# Patient Record
Sex: Female | Born: 1950 | Race: White | Hispanic: No | Smoking: Former smoker
Health system: Southern US, Community
[De-identification: ages and names within clinical notes are randomized; demographics above are authoritative.]

## PROBLEM LIST (undated history)

## (undated) DIAGNOSIS — I38 Endocarditis, valve unspecified: Secondary | ICD-10-CM

## (undated) DIAGNOSIS — D649 Anemia, unspecified: Secondary | ICD-10-CM

## (undated) DIAGNOSIS — A499 Bacterial infection, unspecified: Secondary | ICD-10-CM

## (undated) DIAGNOSIS — T7840XA Allergy, unspecified, initial encounter: Secondary | ICD-10-CM

## (undated) DIAGNOSIS — N390 Urinary tract infection, site not specified: Secondary | ICD-10-CM

## (undated) DIAGNOSIS — I10 Essential (primary) hypertension: Secondary | ICD-10-CM

## (undated) DIAGNOSIS — K219 Gastro-esophageal reflux disease without esophagitis: Secondary | ICD-10-CM

## (undated) HISTORY — PX: CHOLECYSTECTOMY: SHX55

## (undated) HISTORY — DX: Urinary tract infection, site not specified: N39.0

## (undated) HISTORY — DX: Bacterial infection, unspecified: A49.9

## (undated) HISTORY — PX: UPPER GI ENDOSCOPY: SHX6162

## (undated) HISTORY — DX: Essential (primary) hypertension: I10

## (undated) HISTORY — DX: Allergy, unspecified, initial encounter: T78.40XA

## (undated) HISTORY — DX: Gastro-esophageal reflux disease without esophagitis: K21.9

## (undated) HISTORY — PX: COLONOSCOPY: SHX174

---

## 1998-03-22 ENCOUNTER — Ambulatory Visit (HOSPITAL_BASED_OUTPATIENT_CLINIC_OR_DEPARTMENT_OTHER): Admission: RE | Admit: 1998-03-22 | Discharge: 1998-03-22 | Payer: Self-pay | Admitting: Plastic Surgery

## 1999-03-13 ENCOUNTER — Encounter: Payer: Self-pay | Admitting: Gynecology

## 1999-03-13 ENCOUNTER — Encounter: Admission: RE | Admit: 1999-03-13 | Discharge: 1999-03-13 | Payer: Self-pay | Admitting: Gynecology

## 1999-12-28 ENCOUNTER — Other Ambulatory Visit: Admission: RE | Admit: 1999-12-28 | Discharge: 1999-12-28 | Payer: Self-pay | Admitting: Gynecology

## 2000-04-25 ENCOUNTER — Encounter: Payer: Self-pay | Admitting: Gynecology

## 2000-04-25 ENCOUNTER — Encounter: Admission: RE | Admit: 2000-04-25 | Discharge: 2000-04-25 | Payer: Self-pay | Admitting: Gynecology

## 2000-12-30 ENCOUNTER — Other Ambulatory Visit: Admission: RE | Admit: 2000-12-30 | Discharge: 2000-12-30 | Payer: Self-pay | Admitting: Gynecology

## 2001-05-01 ENCOUNTER — Encounter: Payer: Self-pay | Admitting: Gynecology

## 2001-05-01 ENCOUNTER — Encounter: Admission: RE | Admit: 2001-05-01 | Discharge: 2001-05-01 | Payer: Self-pay | Admitting: Gynecology

## 2002-01-28 ENCOUNTER — Other Ambulatory Visit: Admission: RE | Admit: 2002-01-28 | Discharge: 2002-01-28 | Payer: Self-pay | Admitting: Gynecology

## 2002-05-05 ENCOUNTER — Encounter: Payer: Self-pay | Admitting: Gynecology

## 2002-05-05 ENCOUNTER — Encounter: Admission: RE | Admit: 2002-05-05 | Discharge: 2002-05-05 | Payer: Self-pay | Admitting: Gynecology

## 2003-02-02 ENCOUNTER — Other Ambulatory Visit: Admission: RE | Admit: 2003-02-02 | Discharge: 2003-02-02 | Payer: Self-pay | Admitting: Gynecology

## 2003-05-13 ENCOUNTER — Encounter: Admission: RE | Admit: 2003-05-13 | Discharge: 2003-05-13 | Payer: Self-pay | Admitting: Gynecology

## 2004-02-15 ENCOUNTER — Other Ambulatory Visit: Admission: RE | Admit: 2004-02-15 | Discharge: 2004-02-15 | Payer: Self-pay | Admitting: Gynecology

## 2004-06-16 ENCOUNTER — Encounter: Admission: RE | Admit: 2004-06-16 | Discharge: 2004-06-16 | Payer: Self-pay | Admitting: Gynecology

## 2005-03-13 ENCOUNTER — Other Ambulatory Visit: Admission: RE | Admit: 2005-03-13 | Discharge: 2005-03-13 | Payer: Self-pay | Admitting: Gynecology

## 2005-07-11 ENCOUNTER — Encounter: Admission: RE | Admit: 2005-07-11 | Discharge: 2005-07-11 | Payer: Self-pay | Admitting: Gynecology

## 2006-03-22 ENCOUNTER — Other Ambulatory Visit: Admission: RE | Admit: 2006-03-22 | Discharge: 2006-03-22 | Payer: Self-pay | Admitting: Gynecology

## 2006-07-24 ENCOUNTER — Encounter: Admission: RE | Admit: 2006-07-24 | Discharge: 2006-07-24 | Payer: Self-pay | Admitting: Gynecology

## 2006-10-22 ENCOUNTER — Ambulatory Visit: Payer: Self-pay | Admitting: Internal Medicine

## 2006-11-05 ENCOUNTER — Ambulatory Visit: Payer: Self-pay | Admitting: Internal Medicine

## 2006-11-05 ENCOUNTER — Encounter: Payer: Self-pay | Admitting: Internal Medicine

## 2007-08-06 ENCOUNTER — Encounter: Admission: RE | Admit: 2007-08-06 | Discharge: 2007-08-06 | Payer: Self-pay | Admitting: Gynecology

## 2008-08-16 ENCOUNTER — Encounter: Admission: RE | Admit: 2008-08-16 | Discharge: 2008-08-16 | Payer: Self-pay | Admitting: Gynecology

## 2008-12-28 ENCOUNTER — Telehealth (INDEPENDENT_AMBULATORY_CARE_PROVIDER_SITE_OTHER): Payer: Self-pay | Admitting: *Deleted

## 2009-08-23 ENCOUNTER — Encounter: Admission: RE | Admit: 2009-08-23 | Discharge: 2009-08-23 | Payer: Self-pay | Admitting: Gynecology

## 2010-02-06 ENCOUNTER — Encounter: Payer: Self-pay | Admitting: Gynecology

## 2010-06-13 ENCOUNTER — Other Ambulatory Visit: Payer: Self-pay | Admitting: Gynecology

## 2010-06-28 ENCOUNTER — Other Ambulatory Visit: Payer: Self-pay | Admitting: Gynecology

## 2010-09-01 ENCOUNTER — Other Ambulatory Visit: Payer: Self-pay | Admitting: Gynecology

## 2010-09-01 DIAGNOSIS — Z1231 Encounter for screening mammogram for malignant neoplasm of breast: Secondary | ICD-10-CM

## 2010-09-12 ENCOUNTER — Ambulatory Visit
Admission: RE | Admit: 2010-09-12 | Discharge: 2010-09-12 | Disposition: A | Discharge status: Home / Self Care | Payer: BC Managed Care – PPO | Source: Ambulatory Visit | Attending: Gynecology | Admitting: Gynecology

## 2010-09-12 DIAGNOSIS — Z1231 Encounter for screening mammogram for malignant neoplasm of breast: Secondary | ICD-10-CM

## 2010-12-27 ENCOUNTER — Other Ambulatory Visit: Payer: Self-pay | Admitting: Gynecology

## 2011-06-15 ENCOUNTER — Other Ambulatory Visit: Payer: Self-pay | Admitting: Gynecology

## 2011-10-08 ENCOUNTER — Other Ambulatory Visit: Payer: Self-pay | Admitting: Gynecology

## 2011-10-08 DIAGNOSIS — Z1231 Encounter for screening mammogram for malignant neoplasm of breast: Secondary | ICD-10-CM

## 2011-10-22 ENCOUNTER — Ambulatory Visit
Admission: RE | Admit: 2011-10-22 | Discharge: 2011-10-22 | Disposition: A | Discharge status: Home / Self Care | Payer: BC Managed Care – PPO | Source: Ambulatory Visit | Attending: Gynecology | Admitting: Gynecology

## 2011-10-22 DIAGNOSIS — Z1231 Encounter for screening mammogram for malignant neoplasm of breast: Secondary | ICD-10-CM

## 2011-10-25 ENCOUNTER — Encounter: Payer: Self-pay | Admitting: Internal Medicine

## 2011-10-26 ENCOUNTER — Other Ambulatory Visit: Payer: Self-pay | Admitting: Dermatology

## 2012-07-16 ENCOUNTER — Encounter: Payer: Self-pay | Admitting: Internal Medicine

## 2012-09-18 ENCOUNTER — Encounter: Payer: BC Managed Care – PPO | Admitting: Internal Medicine

## 2012-10-24 ENCOUNTER — Ambulatory Visit (AMBULATORY_SURGERY_CENTER): Payer: BC Managed Care – PPO | Admitting: *Deleted

## 2012-10-24 VITALS — Ht 69.0 in | Wt 170.8 lb

## 2012-10-24 DIAGNOSIS — Z860100 Personal history of colon polyps, unspecified: Secondary | ICD-10-CM

## 2012-10-24 MED ORDER — MOVIPREP 100 G PO SOLR: 1.0000 | Freq: Once | ORAL | Status: DC

## 2012-10-24 NOTE — Progress Notes (Signed)
Denies allergies to eggs or soy products. Denies complications with sedation or anesthesia. 

## 2012-10-27 ENCOUNTER — Encounter: Payer: Self-pay | Admitting: Internal Medicine

## 2012-11-07 ENCOUNTER — Encounter: Payer: Self-pay | Admitting: Internal Medicine

## 2012-11-07 ENCOUNTER — Ambulatory Visit (AMBULATORY_SURGERY_CENTER): Payer: BC Managed Care – PPO | Admitting: Internal Medicine

## 2012-11-07 VITALS — BP 131/68 | HR 68 | Temp 97.6°F | Resp 19 | Ht 69.0 in | Wt 170.0 lb

## 2012-11-07 DIAGNOSIS — Z86010 Personal history of colonic polyps: Secondary | ICD-10-CM

## 2012-11-07 MED ORDER — SODIUM CHLORIDE 0.9 % IV SOLN: 500.0000 mL | INTRAVENOUS | Status: DC

## 2012-11-07 NOTE — Progress Notes (Signed)
A/ox3 pleased with MAC, report to Jane RN 

## 2012-11-07 NOTE — Progress Notes (Signed)
Patient did not have preoperative order for IV antibiotic SSI prophylaxis. (G8918)  Patient did not experience any of the following events: a burn prior to discharge; a fall within the facility; wrong site/side/patient/procedure/implant event; or a hospital transfer or hospital admission upon discharge from the facility. (G8907)  

## 2012-11-07 NOTE — Patient Instructions (Addendum)
YOU HAD AN ENDOSCOPIC PROCEDURE TODAY AT THE [facility]: Refer to the procedure report that was given to you for any specific questions about what was found during the examination.  If the procedure report does not answer your questions, please call your gastroenterologist to clarify.  If you requested that your care partner not be given the details of your procedure findings, then the procedure report has been included in a sealed envelope for you to review at your convenience later.  YOU SHOULD EXPECT: Some feelings of bloating in the abdomen. Passage of more gas than usual.  Walking can help get rid of the air that was put into your GI tract during the procedure and reduce the bloating. If you had a lower endoscopy (such as a colonoscopy or flexible sigmoidoscopy) you may notice spotting of blood in your stool or on the toilet paper. If you underwent a bowel prep for your procedure, then you may not have a normal bowel movement for a few days.  DIET: Your first meal following the procedure should be a light meal and then it is ok to progress to your normal diet.  A half-sandwich or bowl of soup is an example of a good first meal.  Heavy or fried foods are harder to digest and may make you feel nauseous or bloated.  Likewise meals heavy in dairy and vegetables can cause extra gas to form and this can also increase the bloating.  Drink plenty of fluids but you should avoid alcoholic beverages for 24 hours.  ACTIVITY: Your care partner should take you home directly after the procedure.  You should plan to take it easy, moving slowly for the rest of the day.  You can resume normal activity the day after the procedure however you should NOT DRIVE or use heavy machinery for 24 hours (because of the sedation medicines used during the test).    SYMPTOMS TO REPORT IMMEDIATELY: A gastroenterologist can be reached at any hour.  During normal business hours, 8:30 AM to 5:00 PM Monday through Friday,  call [phone].  After hours and on weekends, please call the GI answering service at [phone] who will take a message and have the physician on call contact you.   Following lower endoscopy (colonoscopy or flexible sigmoidoscopy):  Excessive amounts of blood in the stool  Significant tenderness or worsening of abdominal pains  Swelling of the abdomen that is new, acute  Fever of 100F or higher   FOLLOW UP: Our staff will call the home number listed on your records the next business day following your procedure to check on you and address any questions or concerns that you may have at that time regarding the information given to you following your procedure. This is a courtesy call and so if there is no answer at the home number and we have not heard from you through the emergency physician on call, we will assume that you have returned to your regular daily activities without incident.  SIGNATURES/CONFIDENTIALITY: You and/or your care partner have signed paperwork which will be entered into your electronic medical record.  These signatures attest to the fact that that the information above on your After Visit Summary has been reviewed and is understood.  Full responsibility of the confidentiality of this discharge information lies with you and/or your care-partner.   Continue your normal medications Please read over handouts about diverticulosis and high fiber diets Follow up in 10 years

## 2012-11-07 NOTE — Op Note (Signed)
[facility] [address] [place], [address]   COLONOSCOPY PROCEDURE REPORT  PATIENT: Lisa Medina, Lisa Medina  MR#: [id number] BIRTHDATE: May 02, 1950 , 62  yrs. old GENDER: Female ENDOSCOPIST: Roxy Cedar, MD REFERRED [id number] Program Recall PROCEDURE DATE:  11/07/2012 PROCEDURE:   Colonoscopy, surveillance First Screening Colonoscopy - Avg.  risk and is 50 yrs.  old or older - No.  Prior Negative Screening - Now for repeat screening. N/A  History of Adenoma - Now for follow-up colonoscopy & has been > or = to 3 yrs.  Yes hx of adenoma.  Has been 3 or more years since last colonoscopy.  Polyps Removed Today? No.  Recommend repeat exam, <10 yrs? No. ASA CLASS:   Class II INDICATIONS:Patient's personal history of colon polyps. Index 2003 (-); F/U 2008 (small TA) MEDICATIONS: MAC sedation, administered by CRNA and propofol (Diprivan) 250mg  IV  DESCRIPTION OF PROCEDURE:   After the risks benefits and alternatives of the procedure were thoroughly explained, informed consent was obtained.  A digital rectal exam revealed no abnormalities of the rectum.   The LB [id number]  endoscope was introduced through the anus and advanced to the cecum, which was identified by both the appendix and ileocecal valve. No adverse events experienced.   The quality of the prep was excellent, using MoviPrep  The instrument was then slowly withdrawn as the colon was fully examined.    COLON FINDINGS: Mild diverticulosis was noted in the sigmoid colon. The colon was otherwise normal.  There was no diverticulosis, inflammation, polyps or cancers unless previously stated. Retroflexed views revealed internal hemorrhoids. The time to cecum=6 minutes 02 seconds.  Withdrawal time=6 minutes 10 seconds. The scope was withdrawn and the procedure completed. COMPLICATIONS: There were no complications.  ENDOSCOPIC IMPRESSION: 1.   Mild diverticulosis was noted in the sigmoid colon 2.    The colon was otherwise normal  RECOMMENDATIONS: 1. Continue current colorectal surveillance recommendations  with a repeat colonoscopy in 10 years.   eSigned:  Roxy Cedar, MD 11/07/2012 11:49 AM   cc: The Patient and Marylu Lund Dear, MD

## 2012-11-10 ENCOUNTER — Telehealth: Payer: Self-pay

## 2012-11-10 NOTE — Telephone Encounter (Signed)
  Follow up Call-  Call back number 11/07/2012  Post procedure Call Back phone  # [phone]  Permission to leave phone message Yes     Patient questions:  Do you have a fever, pain , or abdominal swelling? no Pain Score  0 *  Have you tolerated food without any problems? yes  Have you been able to return to your normal activities? yes  Do you have any questions about your discharge instructions: Diet   no Medications  no Follow up visit  no  Do you have questions or concerns about your Care? no  Actions: * If pain score is 4 or above: No action needed, pain <4.

## 2013-07-14 ENCOUNTER — Ambulatory Visit: Payer: Self-pay | Admitting: Orthopedic Surgery

## 2013-07-22 ENCOUNTER — Ambulatory Visit: Payer: Self-pay | Admitting: Family Medicine

## 2013-11-18 ENCOUNTER — Ambulatory Visit (INDEPENDENT_AMBULATORY_CARE_PROVIDER_SITE_OTHER): Payer: BC Managed Care – PPO | Admitting: Family Medicine

## 2013-11-18 ENCOUNTER — Encounter (INDEPENDENT_AMBULATORY_CARE_PROVIDER_SITE_OTHER): Payer: Self-pay

## 2013-11-18 ENCOUNTER — Encounter: Payer: Self-pay | Admitting: Family Medicine

## 2013-11-18 VITALS — BP 122/76 | HR 55 | Temp 98.0°F | Ht 68.5 in | Wt 170.8 lb

## 2013-11-18 DIAGNOSIS — K219 Gastro-esophageal reflux disease without esophagitis: Secondary | ICD-10-CM

## 2013-11-18 DIAGNOSIS — R635 Abnormal weight gain: Secondary | ICD-10-CM | POA: Insufficient documentation

## 2013-11-18 DIAGNOSIS — Z13220 Encounter for screening for lipoid disorders: Secondary | ICD-10-CM

## 2013-11-18 DIAGNOSIS — Z780 Asymptomatic menopausal state: Secondary | ICD-10-CM | POA: Insufficient documentation

## 2013-11-18 DIAGNOSIS — I10 Essential (primary) hypertension: Secondary | ICD-10-CM

## 2013-11-18 LAB — LIPID PANEL
CHOL/HDL RATIO: 4
CHOLESTEROL: 208 mg/dL — AB (ref 0–200)
HDL: 58.5 mg/dL (ref >39.00)
LDL CALC: 129 mg/dL — AB (ref 0–99)
NonHDL: 149.5
TRIGLYCERIDES: 101 mg/dL (ref 0.0–149.0)
VLDL: 20.2 mg/dL (ref 0.0–40.0)

## 2013-11-18 LAB — CBC WITH DIFFERENTIAL/PLATELET
BASOS PCT: 0.4 % (ref 0.0–3.0)
Basophils Absolute: 0 10*3/uL (ref 0.0–0.1)
Eosinophils Absolute: 0.2 10*3/uL (ref 0.0–0.7)
Eosinophils Relative: 3.1 % (ref 0.0–5.0)
HCT: 40.5 % (ref 36.0–46.0)
Hemoglobin: 13.5 g/dL (ref 12.0–15.0)
LYMPHS PCT: 26.7 % (ref 12.0–46.0)
Lymphs Abs: 1.9 10*3/uL (ref 0.7–4.0)
MCHC: 33.3 g/dL (ref 30.0–36.0)
MCV: 92.4 fl (ref 78.0–100.0)
MONO ABS: 0.6 10*3/uL (ref 0.1–1.0)
MONOS PCT: 8 % (ref 3.0–12.0)
NEUTROS PCT: 61.8 % (ref 43.0–77.0)
Neutro Abs: 4.3 10*3/uL (ref 1.4–7.7)
PLATELETS: 273 10*3/uL (ref 150.0–400.0)
RBC: 4.39 Mil/uL (ref 3.87–5.11)
RDW: 13.4 % (ref 11.5–15.5)
WBC: 7 10*3/uL (ref 4.0–10.5)

## 2013-11-18 LAB — COMPREHENSIVE METABOLIC PANEL
ALT: 26 U/L (ref 0–35)
AST: 26 U/L (ref 0–37)
Albumin: 3.7 g/dL (ref 3.5–5.2)
Alkaline Phosphatase: 73 U/L (ref 39–117)
BUN: 14 mg/dL (ref 6–23)
CALCIUM: 9.6 mg/dL (ref 8.4–10.5)
CO2: 25 meq/L (ref 19–32)
Chloride: 106 mEq/L (ref 96–112)
Creatinine, Ser: 0.9 mg/dL (ref 0.4–1.2)
GFR: 64.11 mL/min (ref >60.00)
Glucose, Bld: 91 mg/dL (ref 70–99)
POTASSIUM: 4.5 meq/L (ref 3.5–5.1)
Sodium: 141 mEq/L (ref 135–145)
Total Bilirubin: 0.4 mg/dL (ref 0.2–1.2)
Total Protein: 7.2 g/dL (ref 6.0–8.3)

## 2013-11-18 NOTE — Progress Notes (Signed)
Subjective:    Patient ID: Lisa Medina, female    DOB: 1950/09/08, 63 y.o.   MRN: [id number]  HPI  64 yo pleasant female here to establish care.  HTN- started on BP meds several years ago- Benicar HCTZ.  Started to feel dizzy when she stood from a seated position so she stopped taking it 6 months ago.  Feels much less fatigued, dizziness has resolved. Has BP cuff at home- checks it regularly, never above 130s/80s. Denies HA, blurred vision, CP or SOB.  Feels her BP improved when she stopped taking HRT.  Off HRT for over a year without hot flashes, insomnia or vaginal dryness.  Weight gain- frustrated that she cannot seem to lose weight. She is swimming 3 days per week, working on portion control. She was told by previous PCP, Dr. Dear that her "thyroid was off." Mom was recently diagnosed with hyperthyroidism. She denies changes in her hair, bowel or skin. No tremor or difficulty swallowing.  She does have significant stressors- elderly mom lives with her but she feels well supported and feels she is handling this well.  No current outpatient prescriptions on file prior to visit.   No current facility-administered medications on file prior to visit.    Allergies  Allergen Reactions  . Macrobid [Nitrofurantoin] Swelling    Past Medical History  Diagnosis Date  . Hypertension   . Urinary tract bacterial infections   . GERD (gastroesophageal reflux disease)     Past Surgical History  Procedure Laterality Date  . Upper gi endoscopy    . Colonoscopy  2003, 2008    Family History  Problem Relation Age of Onset  . Colon cancer Neg Hx   . Esophageal cancer Neg Hx   . Rectal cancer Neg Hx   . Stomach cancer Neg Hx   . Hyperlipidemia Mother   . Stroke Mother   . Hypertension Mother   . Heart disease Father   . Hypertension Father   . Hypertension Brother   . Hypertension Maternal Grandmother   . Hypertension Maternal Grandfather   . Hypertension Paternal  Grandmother   . Hypertension Paternal Grandfather     History   Social History  . Marital Status: Married    Spouse Name: N/A    Number of Children: N/A  . Years of Education: N/A   Occupational History  . Not on file.   Social History Main Topics  . Smoking status: Former Smoker    Quit date: 01/16/1992  . Smokeless tobacco: Never Used  . Alcohol Use: 1.2 oz/week    2 Glasses of wine per week  . Drug Use: No  . Sexual Activity: Yes   Other Topics Concern  . Not on file   Social History Narrative   The PMH, PSH, Social History, Family History, Medications, and allergies have been reviewed in [facility], and have been updated if relevant.   Review of Systems  Constitutional: Negative.   HENT: Negative.   Respiratory: Negative.   Endocrine: Negative.   Musculoskeletal: Negative.   Hematological: Negative.   Psychiatric/Behavioral: Negative.   All other systems reviewed and are negative.      Objective:   Physical Exam  Constitutional: She is oriented to person, place, and time. She appears well-developed and well-nourished. No distress.  HENT:  Head: Normocephalic and atraumatic.  Eyes: Pupils are equal, round, and reactive to light.  Neck: Normal range of motion. Neck supple. No thyromegaly present.  Cardiovascular: Normal rate, regular rhythm  and normal heart sounds.   Pulmonary/Chest: Effort normal and breath sounds normal.  Abdominal: Soft. Bowel sounds are normal.  Musculoskeletal: Normal range of motion. She exhibits no edema.  Lymphadenopathy:    She has no cervical adenopathy.  Neurological: She is alert and oriented to person, place, and time. No cranial nerve deficit.  Skin: Skin is warm and dry.  Psychiatric: She has a normal mood and affect. Her behavior is normal. Judgment and thought content normal.   BP 122/76 mmHg  Pulse 55  Temp(Src) 98 F (36.7 C) (Oral)  Ht 5' 8.5" (1.74 m)  Wt 170 lb 12 oz (77.452 kg)  BMI 25.58 kg/m2  SpO2 97%         Assessment & Plan:

## 2013-11-18 NOTE — Assessment & Plan Note (Signed)
Controlled with prn Nexium.

## 2013-11-18 NOTE — Progress Notes (Signed)
Pre visit review using our clinic review tool, if applicable. No additional management support is needed unless otherwise documented below in the visit note. 

## 2013-11-18 NOTE — Patient Instructions (Signed)
It was nice to meet you. I will call you with your lab results and you can see them online.

## 2013-11-18 NOTE — Assessment & Plan Note (Signed)
Deteriorated. Discussed recording caloric intake. Will also check labs today. Orders Placed This Encounter  Procedures  . Comprehensive metabolic panel  . TSH  . T4, Free  . CBC with Differential  . Lipid panel

## 2013-11-18 NOTE — Assessment & Plan Note (Signed)
Asymptomatic without HRT.

## 2013-11-18 NOTE — Assessment & Plan Note (Signed)
Well controlled without rx! No changes made.

## 2013-11-19 ENCOUNTER — Other Ambulatory Visit: Payer: Self-pay | Admitting: Family Medicine

## 2013-11-19 ENCOUNTER — Encounter: Payer: Self-pay | Admitting: *Deleted

## 2013-11-19 ENCOUNTER — Telehealth: Payer: Self-pay | Admitting: Family Medicine

## 2013-11-19 DIAGNOSIS — R319 Hematuria, unspecified: Secondary | ICD-10-CM | POA: Insufficient documentation

## 2013-11-19 DIAGNOSIS — N12 Tubulo-interstitial nephritis, not specified as acute or chronic: Secondary | ICD-10-CM

## 2013-11-19 LAB — TSH: TSH: 4.37 u[IU]/mL (ref 0.35–4.50)

## 2013-11-19 LAB — T4, FREE: FREE T4: 0.8 ng/dL (ref 0.60–1.60)

## 2013-11-19 NOTE — Telephone Encounter (Signed)
emmi emaied

## 2013-11-21 ENCOUNTER — Encounter: Payer: Self-pay | Admitting: Family Medicine

## 2014-01-26 ENCOUNTER — Encounter: Payer: Self-pay | Admitting: Family Medicine

## 2014-06-04 ENCOUNTER — Other Ambulatory Visit (INDEPENDENT_AMBULATORY_CARE_PROVIDER_SITE_OTHER): Payer: BLUE CROSS/BLUE SHIELD

## 2014-06-04 ENCOUNTER — Other Ambulatory Visit: Payer: Self-pay | Admitting: Family Medicine

## 2014-06-04 DIAGNOSIS — I10 Essential (primary) hypertension: Secondary | ICD-10-CM

## 2014-06-04 DIAGNOSIS — Z13220 Encounter for screening for lipoid disorders: Secondary | ICD-10-CM

## 2014-06-04 DIAGNOSIS — E785 Hyperlipidemia, unspecified: Secondary | ICD-10-CM

## 2014-06-04 DIAGNOSIS — R635 Abnormal weight gain: Secondary | ICD-10-CM

## 2014-06-05 LAB — BASIC METABOLIC PANEL
BUN: 16 mg/dL (ref 6–23)
CO2: 26 meq/L (ref 19–32)
Calcium: 9.3 mg/dL (ref 8.4–10.5)
Chloride: 104 mEq/L (ref 96–112)
Creat: 0.92 mg/dL (ref 0.50–1.10)
Glucose, Bld: 81 mg/dL (ref 70–99)
POTASSIUM: 4.4 meq/L (ref 3.5–5.3)
SODIUM: 139 meq/L (ref 135–145)

## 2014-06-05 LAB — LIPID PANEL
CHOLESTEROL: 190 mg/dL (ref 0–200)
HDL: 70 mg/dL (ref >=46)
LDL Cholesterol: 104 mg/dL — ABNORMAL HIGH (ref 0–99)
Total CHOL/HDL Ratio: 2.7 Ratio
Triglycerides: 80 mg/dL (ref <150)
VLDL: 16 mg/dL (ref 0–40)

## 2014-06-05 LAB — TSH: TSH: 7.111 u[IU]/mL — ABNORMAL HIGH (ref 0.350–4.500)

## 2014-06-16 ENCOUNTER — Ambulatory Visit (INDEPENDENT_AMBULATORY_CARE_PROVIDER_SITE_OTHER): Payer: BLUE CROSS/BLUE SHIELD | Admitting: Family Medicine

## 2014-06-16 ENCOUNTER — Encounter: Payer: Self-pay | Admitting: Family Medicine

## 2014-06-16 ENCOUNTER — Other Ambulatory Visit (HOSPITAL_COMMUNITY)
Admission: RE | Admit: 2014-06-16 | Discharge: 2014-06-16 | Disposition: A | Discharge status: Home / Self Care | Payer: BLUE CROSS/BLUE SHIELD | Source: Ambulatory Visit | Attending: Family Medicine | Admitting: Family Medicine

## 2014-06-16 VITALS — BP 114/78 | HR 68 | Temp 98.4°F | Ht 68.0 in | Wt 169.2 lb

## 2014-06-16 DIAGNOSIS — R946 Abnormal results of thyroid function studies: Secondary | ICD-10-CM

## 2014-06-16 DIAGNOSIS — Z1239 Encounter for other screening for malignant neoplasm of breast: Secondary | ICD-10-CM

## 2014-06-16 DIAGNOSIS — Z23 Encounter for immunization: Secondary | ICD-10-CM | POA: Diagnosis not present

## 2014-06-16 DIAGNOSIS — Z0000 Encounter for general adult medical examination without abnormal findings: Secondary | ICD-10-CM | POA: Diagnosis not present

## 2014-06-16 DIAGNOSIS — I10 Essential (primary) hypertension: Secondary | ICD-10-CM

## 2014-06-16 DIAGNOSIS — Z01419 Encounter for gynecological examination (general) (routine) without abnormal findings: Secondary | ICD-10-CM | POA: Insufficient documentation

## 2014-06-16 DIAGNOSIS — Z1151 Encounter for screening for human papillomavirus (HPV): Secondary | ICD-10-CM | POA: Insufficient documentation

## 2014-06-16 DIAGNOSIS — R7989 Other specified abnormal findings of blood chemistry: Secondary | ICD-10-CM | POA: Insufficient documentation

## 2014-06-16 LAB — TSH: TSH: 4 u[IU]/mL (ref 0.35–4.50)

## 2014-06-16 LAB — T4, FREE: FREE T4: 0.73 ng/dL (ref 0.60–1.60)

## 2014-06-16 NOTE — Patient Instructions (Signed)
Great to see you. I will call you with your lab results. Please call to set up your mammogram.

## 2014-06-16 NOTE — Progress Notes (Signed)
Subjective:    Patient ID: Lisa Medina, female    DOB: Jan 12, 1951, 64 y.o.   MRN: [id number]  HPI  64 yo pleasant female who established care with me in 11/2013, here for CPX/Pap.  Colonoscopy 11/07/12- Dr. Marina Goodell, 10 year recall. Mammogram 07/2013 LMP 2011 No h/o postmenopausal bleeding.  No h/o abnormal pap smears in past 5 years.  HTN- started on BP meds several years ago- Benicar HCTZ.  Started to feel dizzy when she stood from a seated position so she stopped taking it 6 months ago.  Feels much less fatigued, dizziness has resolved. Has BP cuff at home- checks it regularly, never above 130s/80s. Denies HA, blurred vision, CP or SOB.  Feels her BP improved when she stopped taking HRT.  Off HRT for over a year without hot flashes, insomnia or vaginal dryness.  GERD- takes nexium 20 mg daily.  Elevated TSH- she has noticed difficulty losing weight.  Denies any other symptoms of hypo or hyperthyroidism. 7.111   Lab Results  Component Value Date   CHOL 190 06/04/2014   HDL 70 06/04/2014   LDLCALC 104* 06/04/2014   TRIG 80 06/04/2014   CHOLHDL 2.7 06/04/2014   Lab Results  Component Value Date   CREATININE 0.92 06/04/2014   Lab Results  Component Value Date   TSH 7.111* 06/04/2014   Lab Results  Component Value Date   WBC 7.0 11/18/2013   HGB 13.5 11/18/2013   HCT 40.5 11/18/2013   MCV 92.4 11/18/2013   PLT 273.0 11/18/2013     Current Outpatient Prescriptions on File Prior to Visit  Medication Sig Dispense Refill  . esomeprazole (NEXIUM) 20 MG capsule Take 20 mg by mouth daily at 12 noon.     No current facility-administered medications on file prior to visit.    Allergies  Allergen Reactions  . Macrobid [Nitrofurantoin] Swelling    Past Medical History  Diagnosis Date  . Hypertension   . Urinary tract bacterial infections   . GERD (gastroesophageal reflux disease)     Past Surgical History  Procedure Laterality Date  . Upper gi endoscopy     . Colonoscopy  2003, 2008    Family History  Problem Relation Age of Onset  . Colon cancer Neg Hx   . Esophageal cancer Neg Hx   . Rectal cancer Neg Hx   . Stomach cancer Neg Hx   . Hyperlipidemia Mother   . Stroke Mother   . Hypertension Mother   . Heart disease Father   . Hypertension Father   . Hypertension Brother   . Hypertension Maternal Grandmother   . Hypertension Maternal Grandfather   . Hypertension Paternal Grandmother   . Hypertension Paternal Grandfather     History   Social History  . Marital Status: Married    Spouse Name: N/A  . Number of Children: N/A  . Years of Education: N/A   Occupational History  . Not on file.   Social History Main Topics  . Smoking status: Former Smoker    Quit date: 01/16/1992  . Smokeless tobacco: Never Used  . Alcohol Use: 1.2 oz/week    2 Glasses of wine per week  . Drug Use: No  . Sexual Activity: Yes   Other Topics Concern  . Not on file   Social History Narrative   The PMH, PSH, Social History, Family History, Medications, and allergies have been reviewed in [facility], and have been updated if relevant.   Review of Systems  Constitutional: Negative.   HENT: Negative.   Eyes: Negative.   Respiratory: Negative.   Endocrine: Negative.   Genitourinary: Negative.   Musculoskeletal: Negative.   Skin: Negative.   Allergic/Immunologic: Negative.   Neurological: Negative.   Hematological: Negative.   Psychiatric/Behavioral: Negative.   All other systems reviewed and are negative.      Objective:   Physical Exam  Constitutional: She is oriented to person, place, and time. She appears well-developed and well-nourished. No distress.  HENT:  Head: Normocephalic and atraumatic.  Eyes: Pupils are equal, round, and reactive to light.  Neck: Normal range of motion. Neck supple. No thyromegaly present.  Cardiovascular: Normal rate, regular rhythm and normal heart sounds.   Pulmonary/Chest: Effort normal and breath  sounds normal.  Abdominal: Soft. Bowel sounds are normal.  Genitourinary:          External: normal female genitalia without lesions or masses        Vagina: normal without lesions or masses        Cervix: normal without lesions or masses        Adnexa: normal bimanual exam without masses or fullness        Uterus: normal by palpation        Pap smear: performed   Musculoskeletal: Normal range of motion. She exhibits no edema.  Lymphadenopathy:    She has no cervical adenopathy.  Neurological: She is alert and oriented to person, place, and time. No cranial nerve deficit.  Skin: Skin is warm and dry.  Psychiatric: She has a normal mood and affect. Her behavior is normal. Judgment and thought content normal.   BP 114/78 mmHg  Pulse 68  Temp(Src) 98.4 F (36.9 C) (Oral)  Ht 5\' 8"  (1.727 m)  Wt 169 lb 4 oz (76.771 kg)  BMI 25.74 kg/m2  SpO2 95%        Assessment & Plan:

## 2014-06-16 NOTE — Progress Notes (Signed)
Pre visit review using our clinic review tool, if applicable. No additional management support is needed unless otherwise documented below in the visit note. 

## 2014-06-16 NOTE — Addendum Note (Signed)
Addended by: Modena Nunnery on: 06/16/2014 12:39 PM   Modules accepted: Orders

## 2014-06-16 NOTE — Assessment & Plan Note (Signed)
Reviewed preventive care protocols, scheduled due services, and updated immunizations Discussed nutrition, exercise, diet, and healthy lifestyle.  Zostavax given today. Mammogram ordered- pt to set up. Pap smear done today.

## 2014-06-16 NOTE — Assessment & Plan Note (Signed)
New- ? Hypothyroid. Check TSH and FT4 today- may need thyroid replacement therapy. The patient indicates understanding of these issues and agrees with the plan.

## 2014-06-17 ENCOUNTER — Other Ambulatory Visit: Payer: Self-pay | Admitting: Family Medicine

## 2014-06-17 DIAGNOSIS — Z1231 Encounter for screening mammogram for malignant neoplasm of breast: Secondary | ICD-10-CM

## 2014-06-18 ENCOUNTER — Encounter: Payer: Self-pay | Admitting: *Deleted

## 2014-06-18 ENCOUNTER — Encounter: Payer: Self-pay | Admitting: Family Medicine

## 2014-06-18 LAB — CYTOLOGY - PAP

## 2014-07-26 ENCOUNTER — Ambulatory Visit: Payer: BLUE CROSS/BLUE SHIELD

## 2014-07-27 ENCOUNTER — Ambulatory Visit
Admission: RE | Admit: 2014-07-27 | Discharge: 2014-07-27 | Disposition: A | Discharge status: Home / Self Care | Payer: BLUE CROSS/BLUE SHIELD | Source: Ambulatory Visit | Attending: Family Medicine | Admitting: Family Medicine

## 2014-07-27 DIAGNOSIS — Z1231 Encounter for screening mammogram for malignant neoplasm of breast: Secondary | ICD-10-CM

## 2015-04-16 ENCOUNTER — Encounter: Payer: Self-pay | Admitting: Family Medicine

## 2015-06-03 ENCOUNTER — Other Ambulatory Visit: Payer: Self-pay | Admitting: Family Medicine

## 2015-06-03 DIAGNOSIS — Z01419 Encounter for gynecological examination (general) (routine) without abnormal findings: Secondary | ICD-10-CM

## 2015-06-15 ENCOUNTER — Other Ambulatory Visit (INDEPENDENT_AMBULATORY_CARE_PROVIDER_SITE_OTHER): Payer: BLUE CROSS/BLUE SHIELD

## 2015-06-15 DIAGNOSIS — Z01419 Encounter for gynecological examination (general) (routine) without abnormal findings: Secondary | ICD-10-CM | POA: Diagnosis not present

## 2015-06-15 LAB — CBC WITH DIFFERENTIAL/PLATELET
BASOS ABS: 0 10*3/uL (ref 0.0–0.1)
Basophils Relative: 0.8 % (ref 0.0–3.0)
EOS PCT: 2.9 % (ref 0.0–5.0)
Eosinophils Absolute: 0.2 10*3/uL (ref 0.0–0.7)
HCT: 40.3 % (ref 36.0–46.0)
HEMOGLOBIN: 13.6 g/dL (ref 12.0–15.0)
LYMPHS ABS: 1.6 10*3/uL (ref 0.7–4.0)
LYMPHS PCT: 27.3 % (ref 12.0–46.0)
MCHC: 33.8 g/dL (ref 30.0–36.0)
MCV: 90.4 fl (ref 78.0–100.0)
Monocytes Absolute: 0.5 10*3/uL (ref 0.1–1.0)
Monocytes Relative: 7.8 % (ref 3.0–12.0)
NEUTROS PCT: 61.2 % (ref 43.0–77.0)
Neutro Abs: 3.7 10*3/uL (ref 1.4–7.7)
Platelets: 243 10*3/uL (ref 150.0–400.0)
RBC: 4.45 Mil/uL (ref 3.87–5.11)
RDW: 13.5 % (ref 11.5–15.5)
WBC: 6 10*3/uL (ref 4.0–10.5)

## 2015-06-15 LAB — COMPREHENSIVE METABOLIC PANEL
ALBUMIN: 4.2 g/dL (ref 3.5–5.2)
ALK PHOS: 77 U/L (ref 39–117)
ALT: 14 U/L (ref 0–35)
AST: 17 U/L (ref 0–37)
BILIRUBIN TOTAL: 0.5 mg/dL (ref 0.2–1.2)
BUN: 15 mg/dL (ref 6–23)
CALCIUM: 9.2 mg/dL (ref 8.4–10.5)
CO2: 29 mEq/L (ref 19–32)
Chloride: 107 mEq/L (ref 96–112)
Creatinine, Ser: 0.93 mg/dL (ref 0.40–1.20)
GFR: 64.58 mL/min (ref >60.00)
GLUCOSE: 91 mg/dL (ref 70–99)
POTASSIUM: 4.5 meq/L (ref 3.5–5.1)
Sodium: 141 mEq/L (ref 135–145)
TOTAL PROTEIN: 6.7 g/dL (ref 6.0–8.3)

## 2015-06-15 LAB — LIPID PANEL
Cholesterol: 196 mg/dL (ref 0–200)
HDL: 60.6 mg/dL (ref >39.00)
LDL Cholesterol: 121 mg/dL — ABNORMAL HIGH (ref 0–99)
NONHDL: 135.14
TRIGLYCERIDES: 73 mg/dL (ref 0.0–149.0)
Total CHOL/HDL Ratio: 3
VLDL: 14.6 mg/dL (ref 0.0–40.0)

## 2015-06-15 LAB — TSH: TSH: 5.38 u[IU]/mL — ABNORMAL HIGH (ref 0.35–4.50)

## 2015-06-21 ENCOUNTER — Encounter: Payer: Self-pay | Admitting: Family Medicine

## 2015-06-21 ENCOUNTER — Other Ambulatory Visit (HOSPITAL_COMMUNITY)
Admission: RE | Admit: 2015-06-21 | Discharge: 2015-06-21 | Disposition: A | Discharge status: Home / Self Care | Payer: BLUE CROSS/BLUE SHIELD | Source: Ambulatory Visit | Attending: Family Medicine | Admitting: Family Medicine

## 2015-06-21 ENCOUNTER — Ambulatory Visit (INDEPENDENT_AMBULATORY_CARE_PROVIDER_SITE_OTHER): Payer: BLUE CROSS/BLUE SHIELD | Admitting: Family Medicine

## 2015-06-21 VITALS — BP 130/82 | HR 63 | Temp 98.2°F | Ht 68.25 in | Wt 170.5 lb

## 2015-06-21 DIAGNOSIS — Z1151 Encounter for screening for human papillomavirus (HPV): Secondary | ICD-10-CM | POA: Diagnosis present

## 2015-06-21 DIAGNOSIS — Z113 Encounter for screening for infections with a predominantly sexual mode of transmission: Secondary | ICD-10-CM | POA: Insufficient documentation

## 2015-06-21 DIAGNOSIS — R946 Abnormal results of thyroid function studies: Secondary | ICD-10-CM

## 2015-06-21 DIAGNOSIS — R7989 Other specified abnormal findings of blood chemistry: Secondary | ICD-10-CM

## 2015-06-21 DIAGNOSIS — N760 Acute vaginitis: Secondary | ICD-10-CM | POA: Insufficient documentation

## 2015-06-21 DIAGNOSIS — K219 Gastro-esophageal reflux disease without esophagitis: Secondary | ICD-10-CM | POA: Diagnosis not present

## 2015-06-21 DIAGNOSIS — Z01419 Encounter for gynecological examination (general) (routine) without abnormal findings: Secondary | ICD-10-CM

## 2015-06-21 DIAGNOSIS — I10 Essential (primary) hypertension: Secondary | ICD-10-CM

## 2015-06-21 NOTE — Assessment & Plan Note (Signed)
Well controlled without rx. 

## 2015-06-21 NOTE — Progress Notes (Signed)
Pre visit review using our clinic review tool, if applicable. No additional management support is needed unless otherwise documented below in the visit note. 

## 2015-06-21 NOTE — Assessment & Plan Note (Signed)
Reviewed preventive care protocols, scheduled due services, and updated immunizations Discussed nutrition, exercise, diet, and healthy lifestyle.  Discussed USPSTF recommendations of cervical cancer screening.  She is aware that interval of 3 years is recommended but pt would prefer to have pap smear done today.  

## 2015-06-21 NOTE — Assessment & Plan Note (Signed)
Clinically euthyroid. Previously thyroid panel neg.

## 2015-06-21 NOTE — Patient Instructions (Signed)
Great to see you. Have a great summer. Your blood pressure looks great!

## 2015-06-21 NOTE — Progress Notes (Signed)
Subjective:    Patient ID: Lisa Medina, female    DOB: Nov 30, 1950, 65 y.o.   MRN: [id number]  HPI  66 yo pleasant female here for CPX/Pap.  Colonoscopy 11/07/12- Dr. Marina Goodell, 10 year recall. Mammogram 07/27/14 LMP 2011 No h/o postmenopausal bleeding.  No h/o abnormal pap smears in past 5 years. Last pap smear done by me on 06/16/14  HTN- started on BP meds several years ago- Benicar HCTZ.  Started to feel dizzy when she stood from a seated position so she stopped taking it last year.  BP remains well controlled off of rx. Denies HA, blurred vision, CP or SOB.    GERD- takes nexium 20 mg daily.  Elevated TSH- she has noticed difficulty losing weight.  Denies any other symptoms of hypo or hyperthyroidism.   Lab Results  Component Value Date   CHOL 196 06/15/2015   HDL 60.60 06/15/2015   LDLCALC 121* 06/15/2015   TRIG 73.0 06/15/2015   CHOLHDL 3 06/15/2015   Lab Results  Component Value Date   CREATININE 0.93 06/15/2015   Lab Results  Component Value Date   TSH 5.38* 06/15/2015   Lab Results  Component Value Date   WBC 6.0 06/15/2015   HGB 13.6 06/15/2015   HCT 40.3 06/15/2015   MCV 90.4 06/15/2015   PLT 243.0 06/15/2015     Current Outpatient Prescriptions on File Prior to Visit  Medication Sig Dispense Refill  . esomeprazole (NEXIUM) 20 MG capsule Take 20 mg by mouth daily at 12 noon.     No current facility-administered medications on file prior to visit.    Allergies  Allergen Reactions  . Macrobid [Nitrofurantoin] Swelling    Past Medical History  Diagnosis Date  . Hypertension   . Urinary tract bacterial infections   . GERD (gastroesophageal reflux disease)     Past Surgical History  Procedure Laterality Date  . Upper gi endoscopy    . Colonoscopy  2003, 2008    Family History  Problem Relation Age of Onset  . Colon cancer Neg Hx   . Esophageal cancer Neg Hx   . Rectal cancer Neg Hx   . Stomach cancer Neg Hx   . Hyperlipidemia Mother    . Stroke Mother   . Hypertension Mother   . Heart disease Father   . Hypertension Father   . Hypertension Brother   . Hypertension Maternal Grandmother   . Hypertension Maternal Grandfather   . Hypertension Paternal Grandmother   . Hypertension Paternal Grandfather     Social History   Social History  . Marital Status: Married    Spouse Name: N/A  . Number of Children: N/A  . Years of Education: N/A   Occupational History  . Not on file.   Social History Main Topics  . Smoking status: Former Smoker    Quit date: 01/16/1992  . Smokeless tobacco: Never Used  . Alcohol Use: 1.2 oz/week    2 Glasses of wine per week  . Drug Use: No  . Sexual Activity: Yes   Other Topics Concern  . Not on file   Social History Narrative   The PMH, PSH, Social History, Family History, Medications, and allergies have been reviewed in [facility], and have been updated if relevant.   Review of Systems  Constitutional: Negative.   HENT: Negative.   Eyes: Negative.   Respiratory: Negative.   Endocrine: Negative.   Genitourinary: Negative.   Musculoskeletal: Negative.   Skin: Negative.   Allergic/Immunologic:  Negative.   Neurological: Negative.   Hematological: Negative.   Psychiatric/Behavioral: Negative.   All other systems reviewed and are negative.      Objective:   Physical Exam  Constitutional: She is oriented to person, place, and time. She appears well-developed and well-nourished. No distress.  HENT:  Head: Normocephalic and atraumatic.  Eyes: Pupils are equal, round, and reactive to light.  Neck: Normal range of motion. Neck supple. No thyromegaly present.  Cardiovascular: Normal rate, regular rhythm and normal heart sounds.   Pulmonary/Chest: Effort normal and breath sounds normal.  Abdominal: Soft. Bowel sounds are normal.  Genitourinary:   Rectal:  no external abnormalities.   Genitalia:  Pelvic Exam:        External: normal female genitalia without lesions or  masses        Vagina: normal without lesions or masses        Cervix: normal without lesions or masses        Adnexa: normal bimanual exam without masses or fullness        Uterus: normal by palpation        Pap smear: performed   Musculoskeletal: Normal range of motion. She exhibits no edema.  Lymphadenopathy:    She has no cervical adenopathy.  Neurological: She is alert and oriented to person, place, and time. No cranial nerve deficit.  Skin: Skin is warm and dry.  Psychiatric: She has a normal mood and affect. Her behavior is normal. Judgment and thought content normal.   BP 130/82 mmHg  Pulse 63  Temp(Src) 98.2 F (36.8 C) (Oral)  Ht 5' 8.25" (1.734 m)  Wt 170 lb 8 oz (77.338 kg)  BMI 25.72 kg/m2  SpO2 98%        Assessment & Plan:

## 2015-06-21 NOTE — Addendum Note (Signed)
Addended by: Modena Nunnery on: 06/21/2015 10:24 AM   Modules accepted: Orders

## 2015-06-22 ENCOUNTER — Encounter: Payer: BLUE CROSS/BLUE SHIELD | Admitting: Family Medicine

## 2015-06-22 LAB — CYTOLOGY - PAP

## 2015-06-23 ENCOUNTER — Encounter: Payer: Self-pay | Admitting: *Deleted

## 2015-06-23 LAB — CERVICOVAGINAL ANCILLARY ONLY
Bacterial vaginitis: NEGATIVE
Candida vaginitis: NEGATIVE

## 2015-06-24 LAB — CERVICOVAGINAL ANCILLARY ONLY: Herpes: NEGATIVE

## 2015-07-27 ENCOUNTER — Other Ambulatory Visit: Payer: Self-pay | Admitting: Family Medicine

## 2015-07-27 DIAGNOSIS — Z1231 Encounter for screening mammogram for malignant neoplasm of breast: Secondary | ICD-10-CM

## 2015-08-03 ENCOUNTER — Ambulatory Visit: Payer: BLUE CROSS/BLUE SHIELD

## 2015-08-15 ENCOUNTER — Ambulatory Visit
Admission: RE | Admit: 2015-08-15 | Discharge: 2015-08-15 | Disposition: A | Discharge status: Home / Self Care | Payer: BLUE CROSS/BLUE SHIELD | Source: Ambulatory Visit | Attending: Family Medicine | Admitting: Family Medicine

## 2015-08-15 DIAGNOSIS — Z1231 Encounter for screening mammogram for malignant neoplasm of breast: Secondary | ICD-10-CM

## 2016-08-16 ENCOUNTER — Other Ambulatory Visit: Payer: Self-pay | Admitting: Family Medicine

## 2016-08-16 DIAGNOSIS — Z1231 Encounter for screening mammogram for malignant neoplasm of breast: Secondary | ICD-10-CM

## 2016-08-23 ENCOUNTER — Other Ambulatory Visit (HOSPITAL_COMMUNITY)
Admission: RE | Admit: 2016-08-23 | Discharge: 2016-08-23 | Disposition: A | Discharge status: Home / Self Care | Payer: BLUE CROSS/BLUE SHIELD | Source: Ambulatory Visit | Attending: Family Medicine | Admitting: Family Medicine

## 2016-08-23 ENCOUNTER — Encounter: Payer: Self-pay | Admitting: Family Medicine

## 2016-08-23 ENCOUNTER — Ambulatory Visit (INDEPENDENT_AMBULATORY_CARE_PROVIDER_SITE_OTHER): Payer: BLUE CROSS/BLUE SHIELD | Admitting: Family Medicine

## 2016-08-23 VITALS — BP 120/70 | HR 59 | Ht 68.25 in | Wt 146.0 lb

## 2016-08-23 DIAGNOSIS — Z124 Encounter for screening for malignant neoplasm of cervix: Secondary | ICD-10-CM

## 2016-08-23 DIAGNOSIS — F39 Unspecified mood [affective] disorder: Secondary | ICD-10-CM | POA: Insufficient documentation

## 2016-08-23 DIAGNOSIS — Z01419 Encounter for gynecological examination (general) (routine) without abnormal findings: Secondary | ICD-10-CM

## 2016-08-23 DIAGNOSIS — I10 Essential (primary) hypertension: Secondary | ICD-10-CM

## 2016-08-23 DIAGNOSIS — F418 Other specified anxiety disorders: Secondary | ICD-10-CM

## 2016-08-23 DIAGNOSIS — F419 Anxiety disorder, unspecified: Secondary | ICD-10-CM | POA: Insufficient documentation

## 2016-08-23 LAB — CBC WITH DIFFERENTIAL/PLATELET
Basophils Absolute: 0 10*3/uL (ref 0.0–0.1)
Basophils Relative: 0.8 % (ref 0.0–3.0)
EOS PCT: 2.9 % (ref 0.0–5.0)
Eosinophils Absolute: 0.2 10*3/uL (ref 0.0–0.7)
HCT: 43.6 % (ref 36.0–46.0)
HEMOGLOBIN: 14.4 g/dL (ref 12.0–15.0)
Lymphocytes Relative: 30.6 % (ref 12.0–46.0)
Lymphs Abs: 1.6 10*3/uL (ref 0.7–4.0)
MCHC: 33 g/dL (ref 30.0–36.0)
MCV: 94.7 fl (ref 78.0–100.0)
Monocytes Absolute: 0.5 10*3/uL (ref 0.1–1.0)
Monocytes Relative: 9.7 % (ref 3.0–12.0)
NEUTROS PCT: 56 % (ref 43.0–77.0)
Neutro Abs: 2.9 10*3/uL (ref 1.4–7.7)
Platelets: 249 10*3/uL (ref 150.0–400.0)
RBC: 4.6 Mil/uL (ref 3.87–5.11)
RDW: 13.5 % (ref 11.5–15.5)
WBC: 5.3 10*3/uL (ref 4.0–10.5)

## 2016-08-23 LAB — LIPID PANEL
CHOL/HDL RATIO: 3
Cholesterol: 177 mg/dL (ref 0–200)
HDL: 63.6 mg/dL (ref >39.00)
LDL Cholesterol: 100 mg/dL — ABNORMAL HIGH (ref 0–99)
NonHDL: 112.99
Triglycerides: 65 mg/dL (ref 0.0–149.0)
VLDL: 13 mg/dL (ref 0.0–40.0)

## 2016-08-23 LAB — COMPREHENSIVE METABOLIC PANEL
ALBUMIN: 4.4 g/dL (ref 3.5–5.2)
ALK PHOS: 76 U/L (ref 39–117)
ALT: 13 U/L (ref 0–35)
AST: 17 U/L (ref 0–37)
BILIRUBIN TOTAL: 0.6 mg/dL (ref 0.2–1.2)
BUN: 13 mg/dL (ref 6–23)
CO2: 31 mEq/L (ref 19–32)
Calcium: 9.2 mg/dL (ref 8.4–10.5)
Chloride: 103 mEq/L (ref 96–112)
Creatinine, Ser: 1.03 mg/dL (ref 0.40–1.20)
GFR: 57.18 mL/min — AB (ref >60.00)
Glucose, Bld: 89 mg/dL (ref 70–99)
POTASSIUM: 3.9 meq/L (ref 3.5–5.1)
SODIUM: 140 meq/L (ref 135–145)
TOTAL PROTEIN: 7.1 g/dL (ref 6.0–8.3)

## 2016-08-23 LAB — TSH: TSH: 4.88 u[IU]/mL — ABNORMAL HIGH (ref 0.35–4.50)

## 2016-08-23 MED ORDER — ALPRAZOLAM 0.25 MG PO TABS: 0.2500 mg | ORAL_TABLET | Freq: Two times a day (BID) | ORAL | 0 refills | Status: DC | PRN

## 2016-08-23 NOTE — Progress Notes (Signed)
Subjective:   Patient ID: Lisa Medina, female    DOB: 05-18-1950, 66 y.o.   MRN: [id number]  Lisa Medina is a pleasant 66 y.o. year old female who presents to clinic today with Annual Exam (wants a prescription for TRIMETHOPRIM for preventive care, and CHLORDIAZEPOXIDE 10MG  CAPS TO USE AS NEEDED. )  on 08/23/2016  HPI:   Colonoscopy 11/07/12- Dr. Marina Goodell, 10 year recall. Mammogram 08/15/15- has one scheduled for tomorrow.   LMP 2011 No h/o postmenopausal bleeding.  No h/o abnormal pap smears in past 5 years. Last pap smear done by me on 06/22/15  HTN- started on BP meds several years ago- Benicar HCTZ.  Started to feel dizzy when she stood from a seated position so she stopped taking it last year.  BP remains well controlled off of rx. Denies HA, blurred vision, CP or SOB.   Lab Results  Component Value Date   CREATININE 0.93 06/15/2015     GERD- takes nexium 20 mg daily.  Situational anxiety-  Has deteriorated lately.  Dr. Nicholas Lose gave her Librium years ago and she is asking for a refill on this. Under more stress caring for her elderly mother. Denies feeling depressed.  Lab Results  Component Value Date   CHOL 196 06/15/2015   HDL 60.60 06/15/2015   LDLCALC 121 (H) 06/15/2015   TRIG 73.0 06/15/2015   CHOLHDL 3 06/15/2015   Lab Results  Component Value Date   ALT 14 06/15/2015   AST 17 06/15/2015   ALKPHOS 77 06/15/2015   BILITOT 0.5 06/15/2015   Lab Results  Component Value Date   WBC 6.0 06/15/2015   HGB 13.6 06/15/2015   HCT 40.3 06/15/2015   MCV 90.4 06/15/2015   PLT 243.0 06/15/2015   Lab Results  Component Value Date   TSH 5.38 (H) 06/15/2015   Current Outpatient Prescriptions on File Prior to Visit  Medication Sig Dispense Refill  . esomeprazole (NEXIUM) 20 MG capsule Take 20 mg by mouth daily at 12 noon.     No current facility-administered medications on file prior to visit.     Allergies  Allergen Reactions  . Macrobid  [Nitrofurantoin] Swelling    Past Medical History:  Diagnosis Date  . GERD (gastroesophageal reflux disease)   . Hypertension   . Urinary tract bacterial infections     Past Surgical History:  Procedure Laterality Date  . COLONOSCOPY  2003, 2008  . UPPER GI ENDOSCOPY      Family History  Problem Relation Age of Onset  . Colon cancer Neg Hx   . Esophageal cancer Neg Hx   . Rectal cancer Neg Hx   . Stomach cancer Neg Hx   . Hyperlipidemia Mother   . Stroke Mother   . Hypertension Mother   . Heart disease Father   . Hypertension Father   . Hypertension Brother   . Hypertension Maternal Grandmother   . Hypertension Maternal Grandfather   . Hypertension Paternal Grandmother   . Hypertension Paternal Grandfather     Social History   Social History  . Marital status: Married    Spouse name: N/A  . Number of children: N/A  . Years of education: N/A   Occupational History  . Not on file.   Social History Main Topics  . Smoking status: Former Smoker    Quit date: 01/16/1992  . Smokeless tobacco: Never Used  . Alcohol use 1.2 oz/week    2 Glasses of wine per week  .  Drug use: No  . Sexual activity: Yes   Other Topics Concern  . Not on file   Social History Narrative  . No narrative on file   The PMH, PSH, Social History, Family History, Medications, and allergies have been reviewed in [facility], and have been updated if relevant.   Review of Systems  Constitutional: Negative.   HENT: Negative.   Eyes: Negative.   Respiratory: Negative.   Cardiovascular: Negative.   Gastrointestinal: Negative.   Endocrine: Negative.   Genitourinary: Negative.   Musculoskeletal: Negative.   Allergic/Immunologic: Negative.   Neurological: Negative.   Hematological: Negative.   Psychiatric/Behavioral: Negative for agitation, behavioral problems, confusion, decreased concentration, dysphoric mood, self-injury, sleep disturbance and suicidal ideas. The patient is nervous/anxious.  The patient is not hyperactive.   All other systems reviewed and are negative.      Objective:    BP 120/70   Pulse (!) 59   Ht 5' 8.25" (1.734 m)   Wt 146 lb (66.2 kg)   SpO2 99%   BMI 22.04 kg/m    Physical Exam   General:  Well-developed,well-nourished,in no acute distress; alert,appropriate and cooperative throughout examination Head:  normocephalic and atraumatic.   Eyes:  vision grossly intact, PERRL Ears:  R ear normal and L ear normal externally, TMs clear bilaterally Nose:  no external deformity.   Mouth:  good dentition.   Neck:  No deformities, masses, or tenderness noted. Breasts:  No mass, nodules, thickening, tenderness, bulging, retraction, inflamation, nipple discharge or skin changes noted.   Lungs:  Normal respiratory effort, chest expands symmetrically. Lungs are clear to auscultation, no crackles or wheezes. Heart:  Normal rate and regular rhythm. S1 and S2 normal without gallop, murmur, click, rub or other extra sounds. Abdomen:  Bowel sounds positive,abdomen soft and non-tender without masses, organomegaly or hernias noted. Rectal:  no external abnormalities.   Genitalia:  Pelvic Exam:        External: normal female genitalia without lesions or masses        Vagina: normal without lesions or masses        Cervix: normal without lesions or masses        Adnexa: normal bimanual exam without masses or fullness        Uterus: normal by palpation        Pap smear: performed Msk:  No deformity or scoliosis noted of thoracic or lumbar spine.   Extremities:  No clubbing, cyanosis, edema, or deformity noted with normal full range of motion of all joints.   Neurologic:  alert & oriented X3 and gait normal.   Skin:  Intact without suspicious lesions or rashes Cervical Nodes:  No lymphadenopathy noted Axillary Nodes:  No palpable lymphadenopathy Psych:  Cognition and judgment appear intact. Alert and cooperative with normal attention span and concentration. No  apparent delusions, illusions, hallucinations        Assessment & Plan:   Well woman exam - Plan: Comprehensive metabolic panel, CBC with Differential/Platelet, Lipid panel, TSH  Well woman exam with routine gynecological exam  Essential hypertension  Situational anxiety No Follow-up on file.

## 2016-08-23 NOTE — Assessment & Plan Note (Signed)
Well controlled.  No changes made. 

## 2016-08-23 NOTE — Patient Instructions (Signed)
Great to see you. We are starting as needed xanax for anxiety.  Please use sparingly and keep me updated.

## 2016-08-23 NOTE — Addendum Note (Signed)
Addended by: Mady Haagensen on: 08/23/2016 08:47 AM   Modules accepted: Orders

## 2016-08-23 NOTE — Assessment & Plan Note (Signed)
Reviewed preventive care protocols, scheduled due services, and updated immunizations Discussed nutrition, exercise, diet, and healthy lifestyle.  Discussed USPSTF recommendations of cervical cancer screening.  She is aware that interval of 3 years is recommended but pt would prefer to have pap smear done today.  

## 2016-08-23 NOTE — Assessment & Plan Note (Signed)
Discussed short term use of as needed benz- discussed sedation and addiction potential. Rx for xanax printed and given to pt. Call or return to clinic prn if these symptoms worsen or fail to improve as anticipated. The patient indicates understanding of these issues and agrees with the plan.

## 2016-08-24 ENCOUNTER — Ambulatory Visit
Admission: RE | Admit: 2016-08-24 | Discharge: 2016-08-24 | Disposition: A | Discharge status: Home / Self Care | Payer: BLUE CROSS/BLUE SHIELD | Source: Ambulatory Visit | Attending: Family Medicine | Admitting: Family Medicine

## 2016-08-24 DIAGNOSIS — Z1231 Encounter for screening mammogram for malignant neoplasm of breast: Secondary | ICD-10-CM

## 2016-08-27 LAB — CYTOLOGY - PAP
Diagnosis: NEGATIVE
HPV: NOT DETECTED

## 2017-01-24 ENCOUNTER — Encounter: Payer: Self-pay | Admitting: Family Medicine

## 2017-01-24 ENCOUNTER — Ambulatory Visit (INDEPENDENT_AMBULATORY_CARE_PROVIDER_SITE_OTHER): Payer: Medicare Other | Admitting: Family Medicine

## 2017-01-24 VITALS — BP 122/82 | HR 52 | Temp 98.4°F | Ht 68.25 in | Wt 148.4 lb

## 2017-01-24 DIAGNOSIS — R300 Dysuria: Secondary | ICD-10-CM

## 2017-01-24 DIAGNOSIS — Z23 Encounter for immunization: Secondary | ICD-10-CM

## 2017-01-24 DIAGNOSIS — R350 Frequency of micturition: Secondary | ICD-10-CM | POA: Diagnosis not present

## 2017-01-24 LAB — POCT URINALYSIS DIPSTICK
Bilirubin, UA: NEGATIVE
Glucose, UA: NEGATIVE
KETONES UA: NEGATIVE
LEUKOCYTES UA: NEGATIVE
Nitrite, UA: NEGATIVE
Protein, UA: NEGATIVE
SPEC GRAV UA: 1.015 (ref 1.010–1.025)
Urobilinogen, UA: 0.2 E.U./dL
pH, UA: 6 (ref 5.0–8.0)

## 2017-01-24 MED ORDER — SULFAMETHOXAZOLE-TRIMETHOPRIM 800-160 MG PO TABS: 1.0000 | ORAL_TABLET | Freq: Two times a day (BID) | ORAL | 0 refills | Status: DC

## 2017-01-24 NOTE — Progress Notes (Signed)
Prepared for C&S/thx dmf

## 2017-01-24 NOTE — Progress Notes (Signed)
SUBJECTIVE: Lisa Medina is a 67 y.o. female who complains of urinary frequency, urgency and dysuria x 4 days, without flank pain, fever, chills, or abnormal vaginal discharge or bleeding.    Current Outpatient Medications on File Prior to Visit  Medication Sig Dispense Refill  . ALPRAZolam (XANAX) 0.25 MG tablet Take 1 tablet (0.25 mg total) by mouth 2 (two) times daily as needed for anxiety. 30 tablet 0  . esomeprazole (NEXIUM) 20 MG capsule Take 20 mg by mouth daily at 12 noon.     No current facility-administered medications on file prior to visit.     Allergies  Allergen Reactions  . Macrobid [Nitrofurantoin] Swelling    Past Medical History:  Diagnosis Date  . GERD (gastroesophageal reflux disease)   . Hypertension   . Urinary tract bacterial infections     Past Surgical History:  Procedure Laterality Date  . COLONOSCOPY  2003, 2008  . UPPER GI ENDOSCOPY      Family History  Problem Relation Age of Onset  . Colon cancer Neg Hx   . Esophageal cancer Neg Hx   . Rectal cancer Neg Hx   . Stomach cancer Neg Hx   . Hyperlipidemia Mother   . Stroke Mother   . Hypertension Mother   . Heart disease Father   . Hypertension Father   . Hypertension Brother   . Hypertension Maternal Grandmother   . Hypertension Maternal Grandfather   . Hypertension Paternal Grandmother   . Hypertension Paternal Grandfather     Social History   Socioeconomic History  . Marital status: Married    Spouse name: Not on file  . Number of children: Not on file  . Years of education: Not on file  . Highest education level: Not on file  Social Needs  . Financial resource strain: Not on file  . Food insecurity - worry: Not on file  . Food insecurity - inability: Not on file  . Transportation needs - medical: Not on file  . Transportation needs - non-medical: Not on file  Occupational History  . Not on file  Tobacco Use  . Smoking status: Former Smoker    Last attempt to quit:  01/16/1992    Years since quitting: 25.0  . Smokeless tobacco: Never Used  Substance and Sexual Activity  . Alcohol use: Yes    Alcohol/week: 1.2 oz    Types: 2 Glasses of wine per week  . Drug use: No  . Sexual activity: Yes  Other Topics Concern  . Not on file  Social History Narrative  . Not on file   The PMH, PSH, Social History, Family History, Medications, and allergies have been reviewed in [facility], and have been updated if relevant.  OBJECTIVE:  BP 122/82 (BP Location: Left Arm, Patient Position: Sitting, Cuff Size: Normal)   Pulse (!) 52   Temp 98.4 F (36.9 C) (Oral)   Ht 5' 8.25" (1.734 m)   Wt 148 lb 6.4 oz (67.3 kg)   SpO2 98%   BMI 22.40 kg/m   Appears well, in no apparent distress.  Vital signs are normal. The abdomen is soft without tenderness, guarding, mass, rebound or organomegaly. No CVA tenderness or inguinal adenopathy noted. Urine dipstick shows positive for RBC's.   ASSESSMENT: UTI uncomplicated without evidence of pyelonephritis  PLAN: Treatment per orders - Bactrim twice daily x 5 days,  also push fluids, may use Pyridium OTC prn. Call or return to clinic prn if these symptoms worsen or fail  to improve as anticipated.

## 2017-01-24 NOTE — Patient Instructions (Signed)

## 2017-01-25 LAB — URINE CULTURE
MICRO NUMBER:: 90040590
RESULT: NO GROWTH
SPECIMEN QUALITY:: ADEQUATE

## 2017-04-10 DIAGNOSIS — L603 Nail dystrophy: Secondary | ICD-10-CM | POA: Diagnosis not present

## 2017-04-10 DIAGNOSIS — B351 Tinea unguium: Secondary | ICD-10-CM | POA: Diagnosis not present

## 2017-07-25 ENCOUNTER — Other Ambulatory Visit: Payer: Self-pay | Admitting: Family Medicine

## 2017-07-25 DIAGNOSIS — Z1231 Encounter for screening mammogram for malignant neoplasm of breast: Secondary | ICD-10-CM

## 2017-08-22 ENCOUNTER — Encounter: Payer: Self-pay | Admitting: Family Medicine

## 2017-08-26 ENCOUNTER — Ambulatory Visit
Admission: RE | Admit: 2017-08-26 | Discharge: 2017-08-26 | Disposition: A | Discharge status: Home / Self Care | Payer: Medicare Other | Source: Ambulatory Visit | Attending: Family Medicine | Admitting: Family Medicine

## 2017-08-26 DIAGNOSIS — Z1231 Encounter for screening mammogram for malignant neoplasm of breast: Secondary | ICD-10-CM | POA: Diagnosis not present

## 2017-08-28 DIAGNOSIS — H2513 Age-related nuclear cataract, bilateral: Secondary | ICD-10-CM | POA: Diagnosis not present

## 2017-08-28 DIAGNOSIS — H04123 Dry eye syndrome of bilateral lacrimal glands: Secondary | ICD-10-CM | POA: Diagnosis not present

## 2017-08-28 DIAGNOSIS — H0288B Meibomian gland dysfunction left eye, upper and lower eyelids: Secondary | ICD-10-CM | POA: Diagnosis not present

## 2017-08-28 DIAGNOSIS — H1045 Other chronic allergic conjunctivitis: Secondary | ICD-10-CM | POA: Diagnosis not present

## 2017-08-28 DIAGNOSIS — H524 Presbyopia: Secondary | ICD-10-CM | POA: Diagnosis not present

## 2017-08-28 DIAGNOSIS — H0288A Meibomian gland dysfunction right eye, upper and lower eyelids: Secondary | ICD-10-CM | POA: Diagnosis not present

## 2017-08-28 DIAGNOSIS — H5213 Myopia, bilateral: Secondary | ICD-10-CM | POA: Diagnosis not present

## 2017-09-03 ENCOUNTER — Encounter: Payer: Self-pay | Admitting: Family Medicine

## 2017-09-03 ENCOUNTER — Ambulatory Visit (INDEPENDENT_AMBULATORY_CARE_PROVIDER_SITE_OTHER): Payer: Medicare Other | Admitting: Family Medicine

## 2017-09-03 VITALS — BP 126/68 | HR 58 | Temp 98.1°F | Ht 68.5 in | Wt 150.0 lb

## 2017-09-03 DIAGNOSIS — I10 Essential (primary) hypertension: Secondary | ICD-10-CM

## 2017-09-03 DIAGNOSIS — K219 Gastro-esophageal reflux disease without esophagitis: Secondary | ICD-10-CM | POA: Diagnosis not present

## 2017-09-03 DIAGNOSIS — Z0000 Encounter for general adult medical examination without abnormal findings: Secondary | ICD-10-CM

## 2017-09-03 DIAGNOSIS — Z1159 Encounter for screening for other viral diseases: Secondary | ICD-10-CM

## 2017-09-03 DIAGNOSIS — Z114 Encounter for screening for human immunodeficiency virus [HIV]: Secondary | ICD-10-CM | POA: Diagnosis not present

## 2017-09-03 DIAGNOSIS — Z780 Asymptomatic menopausal state: Secondary | ICD-10-CM | POA: Diagnosis not present

## 2017-09-03 DIAGNOSIS — Z23 Encounter for immunization: Secondary | ICD-10-CM

## 2017-09-03 LAB — TSH: TSH: 3.7 u[IU]/mL (ref 0.35–4.50)

## 2017-09-03 LAB — COMPREHENSIVE METABOLIC PANEL
ALT: 12 U/L (ref 0–35)
AST: 14 U/L (ref 0–37)
Albumin: 4.2 g/dL (ref 3.5–5.2)
Alkaline Phosphatase: 72 U/L (ref 39–117)
BUN: 12 mg/dL (ref 6–23)
CO2: 30 mEq/L (ref 19–32)
Calcium: 9.5 mg/dL (ref 8.4–10.5)
Chloride: 103 mEq/L (ref 96–112)
Creatinine, Ser: 1.05 mg/dL (ref 0.40–1.20)
GFR: 55.75 mL/min — AB (ref >60.00)
Glucose, Bld: 96 mg/dL (ref 70–99)
POTASSIUM: 3.8 meq/L (ref 3.5–5.1)
Sodium: 140 mEq/L (ref 135–145)
Total Bilirubin: 0.6 mg/dL (ref 0.2–1.2)
Total Protein: 7.1 g/dL (ref 6.0–8.3)

## 2017-09-03 LAB — CBC
HCT: 42 % (ref 36.0–46.0)
HEMOGLOBIN: 13.9 g/dL (ref 12.0–15.0)
MCHC: 33.1 g/dL (ref 30.0–36.0)
MCV: 92.2 fl (ref 78.0–100.0)
Platelets: 230 10*3/uL (ref 150.0–400.0)
RBC: 4.55 Mil/uL (ref 3.87–5.11)
RDW: 13.3 % (ref 11.5–15.5)
WBC: 5 10*3/uL (ref 4.0–10.5)

## 2017-09-03 LAB — LIPID PANEL
Cholesterol: 198 mg/dL (ref 0–200)
HDL: 69.8 mg/dL (ref >39.00)
LDL CALC: 110 mg/dL — AB (ref 0–99)
NonHDL: 127.7
Total CHOL/HDL Ratio: 3
Triglycerides: 88 mg/dL (ref 0.0–149.0)
VLDL: 17.6 mg/dL (ref 0.0–40.0)

## 2017-09-03 NOTE — Assessment & Plan Note (Addendum)
The patients weight, height, BMI and visual acuity have been recorded in the chart.  Cognitive function assessed.   I have made referrals, counseling and provided education to the patient based review of the above and I have provided the pt with a written personalized care plan for preventive services.  EKG reassuring- bradycardia (chronic for her), otherwise normal. DEXA ordered- pt to schedule. prevnar 13 given today.  Orders Placed This Encounter  Procedures  . DG Bone Density  . Pneumococcal conjugate vaccine 13-valent  . Comprehensive metabolic panel  . Lipid panel  . TSH  . CBC  . Hepatitis C Antibody  . HIV antibody (with reflex)  . EKG 12-Lead

## 2017-09-03 NOTE — Patient Instructions (Addendum)
Great to see you. I will call you with your lab results from today and you can view them online.   Please call the breast center at [phone] to schedule your bone density scan.  Look up advanced directives.

## 2017-09-03 NOTE — Progress Notes (Signed)
Subjective:   Patient ID: Lisa Medina, female    DOB: [birth year], 67 y.o.   MRN: [id number]  Lisa Medina is a pleasant 67 y.o. year old female who presents to clinic today with Welcome to Medicare (Patient is here today for her [facility] to Medicare visit.  Last PAP completed on 8.13.19 WNL and not due till 8.9.2021.  Last Mammogram completed on 8.12.19 WNL RTN in 1 year.  She has never had a BMD done and can get [facility].  She is due for Hep-C and HIV labs and PNV-13 vaccine which pt agrees.  She had tea with equal in it this am.    )  on 09/03/2017  HPI: I have personally reviewed the Medicare Annual Wellness questionnaire and have noted 1. The patient's medical and social history 2. Their use of alcohol, tobacco or illicit drugs 3. Their current medications and supplements 4. The patient's functional ability including ADL's, fall risks, home safety risks and hearing or visual             impairment. 5. Diet and physical activities 6. Evidence for depression or mood disorders  End of life wishes discussed and updated in Social History.  The roster of all physicians providing medical care to patient - is listed in the [employer] section of the chart.  LMP 2011 No h/o postmenopausal bleeding. No h/o abnormal pap smears in past 5 years. Last pap smear done by me on 08/23/2016.  Health Maintenance  Topic Date Due  . Hepatitis C Screening  Feb 04, 1950  . HIV Screening  10/13/1966  . DEXA SCAN  10/12/2016  . PNA vac Low Risk Adult (1 of 2 - PCV13) 10/12/2016  . INFLUENZA VACCINE  10/04/2017 (Originally 08/15/2017)  . TETANUS/TDAP  09/04/2018 (Originally 10/13/1970)  . PAP SMEAR  08/24/2019  . MAMMOGRAM  08/27/2019  . COLONOSCOPY  11/08/2022   Lab Results  Component Value Date   CHOL 177 08/23/2016   HDL 63.60 08/23/2016   LDLCALC 100 (H) 08/23/2016   TRIG 65.0 08/23/2016   CHOLHDL 3 08/23/2016   Lab Results  Component  Value Date   NA 140 08/23/2016   K 3.9 08/23/2016   CL 103 08/23/2016   CO2 31 08/23/2016   Lab Results  Component Value Date   ALT 13 08/23/2016   AST 17 08/23/2016   ALKPHOS 76 08/23/2016   BILITOT 0.6 08/23/2016    Current Outpatient Medications on File Prior to Visit  Medication Sig Dispense Refill  . ALPRAZolam (XANAX) 0.25 MG tablet Take 1 tablet (0.25 mg total) by mouth 2 (two) times daily as needed for anxiety. 30 tablet 0  . esomeprazole (NEXIUM) 20 MG capsule Take 20 mg by mouth daily at 12 noon.     No current facility-administered medications on file prior to visit.     Allergies  Allergen Reactions  . Macrobid [Nitrofurantoin] Swelling    Past Medical History:  Diagnosis Date  . GERD (gastroesophageal reflux disease)   . Hypertension   . Urinary tract bacterial infections     Past Surgical History:  Procedure Laterality Date  . COLONOSCOPY  2003, 2008  . UPPER GI ENDOSCOPY      Family History  Problem Relation Age of Onset  . Hyperlipidemia Mother   . Stroke Mother   . Hypertension Mother   . Heart disease Father   . Hypertension Father   . Hypertension Brother   .  Hypertension Maternal Grandmother   . Hypertension Maternal Grandfather   . Hypertension Paternal Grandmother   . Hypertension Paternal Grandfather   . Colon cancer Neg Hx   . Esophageal cancer Neg Hx   . Rectal cancer Neg Hx   . Stomach cancer Neg Hx     Social History   Socioeconomic History  . Marital status: Married    Spouse name: Not on file  . Number of children: Not on file  . Years of education: Not on file  . Highest education level: Not on file  Occupational History  . Not on file  Social Needs  . Financial resource strain: Not on file  . Food insecurity:    Worry: Not on file    Inability: Not on file  . Transportation needs:    Medical: Not on file    Non-medical: Not on file  Tobacco Use  . Smoking status: Former Smoker    Last attempt to quit:  01/16/1992    Years since quitting: 25.6  . Smokeless tobacco: Never Used  Substance and Sexual Activity  . Alcohol use: Yes    Alcohol/week: 2.0 standard drinks    Types: 2 Glasses of wine per week  . Drug use: No  . Sexual activity: Yes  Lifestyle  . Physical activity:    Days per week: Not on file    Minutes per session: Not on file  . Stress: Not on file  Relationships  . Social connections:    Talks on phone: Not on file    Gets together: Not on file    Attends religious service: Not on file    Active member of club or organization: Not on file    Attends meetings of clubs or organizations: Not on file    Relationship status: Not on file  . Intimate partner violence:    Fear of current or ex partner: Not on file    Emotionally abused: Not on file    Physically abused: Not on file    Forced sexual activity: Not on file  Other Topics Concern  . Not on file  Social History Narrative   Full code   The PMH, PSH, Social History, Family History, Medications, and allergies have been reviewed in [facility], and have been updated if relevant.  Review of Systems  Constitutional: Negative.   HENT: Negative.   Eyes: Negative.   Respiratory: Negative.   Cardiovascular: Negative.   Gastrointestinal: Negative.   Endocrine: Negative.   Genitourinary: Negative.   Musculoskeletal: Negative.   Allergic/Immunologic: Negative.   Neurological: Negative.   Hematological: Negative.   Psychiatric/Behavioral: Negative.   All other systems reviewed and are negative.      Objective:    BP 126/68 (BP Location: Left Arm, Patient Position: Sitting, Cuff Size: Normal)   Pulse (!) 58   Temp 98.1 F (36.7 C) (Oral)   Ht 5' 8.5" (1.74 m)   Wt 150 lb (68 kg)   SpO2 98%   BMI 22.48 kg/m    Physical Exam  Constitutional: She is oriented to person, place, and time. She appears well-developed and well-nourished. No distress.  HENT:  Head: Normocephalic and atraumatic.  Eyes: EOM are normal.    Neck: Normal range of motion.  Cardiovascular: Normal rate and regular rhythm.  Pulmonary/Chest: Effort normal and breath sounds normal.  Abdominal: Soft.  Musculoskeletal: Normal range of motion. She exhibits no edema.  Neurological: She is alert and oriented to person, place, and time. No cranial  nerve deficit.  Skin: Skin is warm and dry. She is not diaphoretic.  Psychiatric: She has a normal mood and affect. Her behavior is normal. Judgment and thought content normal.  Nursing note and vitals reviewed.          Assessment & Plan:   Welcome to Medicare preventive visit - Plan: Pneumococcal conjugate vaccine 13-valent, EKG 12-Lead  Gastroesophageal reflux disease without esophagitis  Essential hypertension - Plan: Comprehensive metabolic panel, Lipid panel, TSH, CBC, EKG 12-Lead  Need for hepatitis C screening test - Plan: Hepatitis C Antibody  Screening for HIV (human immunodeficiency virus) - Plan: HIV antibody (with reflex)  Post-menopausal - Plan: DG Bone Density  Need for pneumococcal vaccination - Plan: Pneumococcal conjugate vaccine 13-valent No follow-ups on file.

## 2017-09-03 NOTE — Assessment & Plan Note (Signed)
Well controlled without rx. 

## 2017-09-04 LAB — HIV ANTIBODY (ROUTINE TESTING W REFLEX): HIV 1&2 Ab, 4th Generation: NONREACTIVE

## 2017-09-04 LAB — HEPATITIS C ANTIBODY
Hepatitis C Ab: NONREACTIVE
SIGNAL TO CUT-OFF: 0.01 (ref <1.00)

## 2017-09-30 ENCOUNTER — Encounter: Payer: Self-pay | Admitting: Family Medicine

## 2017-09-30 DIAGNOSIS — R350 Frequency of micturition: Secondary | ICD-10-CM

## 2017-10-02 ENCOUNTER — Other Ambulatory Visit (INDEPENDENT_AMBULATORY_CARE_PROVIDER_SITE_OTHER): Payer: Medicare Other

## 2017-10-02 DIAGNOSIS — R350 Frequency of micturition: Secondary | ICD-10-CM

## 2017-10-02 LAB — COMPREHENSIVE METABOLIC PANEL
ALT: 17 U/L (ref 0–35)
AST: 19 U/L (ref 0–37)
Albumin: 4.1 g/dL (ref 3.5–5.2)
Alkaline Phosphatase: 71 U/L (ref 39–117)
BUN: 12 mg/dL (ref 6–23)
CHLORIDE: 105 meq/L (ref 96–112)
CO2: 32 mEq/L (ref 19–32)
Calcium: 9.4 mg/dL (ref 8.4–10.5)
Creatinine, Ser: 0.96 mg/dL (ref 0.40–1.20)
GFR: 61.81 mL/min (ref >60.00)
GLUCOSE: 91 mg/dL (ref 70–99)
Potassium: 4.7 mEq/L (ref 3.5–5.1)
SODIUM: 141 meq/L (ref 135–145)
TOTAL PROTEIN: 6.9 g/dL (ref 6.0–8.3)
Total Bilirubin: 0.5 mg/dL (ref 0.2–1.2)

## 2017-10-24 ENCOUNTER — Other Ambulatory Visit: Payer: Self-pay | Admitting: Family Medicine

## 2017-10-24 DIAGNOSIS — E2839 Other primary ovarian failure: Secondary | ICD-10-CM

## 2017-10-28 ENCOUNTER — Ambulatory Visit
Admission: RE | Admit: 2017-10-28 | Discharge: 2017-10-28 | Disposition: A | Discharge status: Home / Self Care | Payer: Medicare Other | Source: Ambulatory Visit | Attending: Family Medicine | Admitting: Family Medicine

## 2017-10-28 ENCOUNTER — Other Ambulatory Visit: Payer: PRIVATE HEALTH INSURANCE

## 2017-10-28 DIAGNOSIS — M85852 Other specified disorders of bone density and structure, left thigh: Secondary | ICD-10-CM | POA: Diagnosis not present

## 2017-10-28 DIAGNOSIS — Z780 Asymptomatic menopausal state: Secondary | ICD-10-CM | POA: Diagnosis not present

## 2017-10-28 DIAGNOSIS — E2839 Other primary ovarian failure: Secondary | ICD-10-CM

## 2017-11-07 DIAGNOSIS — L570 Actinic keratosis: Secondary | ICD-10-CM | POA: Diagnosis not present

## 2018-06-30 ENCOUNTER — Encounter: Payer: Self-pay | Admitting: Family Medicine

## 2018-08-21 ENCOUNTER — Other Ambulatory Visit: Payer: Self-pay | Admitting: Family Medicine

## 2018-08-21 DIAGNOSIS — Z1231 Encounter for screening mammogram for malignant neoplasm of breast: Secondary | ICD-10-CM

## 2018-09-05 DIAGNOSIS — H52223 Regular astigmatism, bilateral: Secondary | ICD-10-CM | POA: Diagnosis not present

## 2018-09-05 DIAGNOSIS — H04123 Dry eye syndrome of bilateral lacrimal glands: Secondary | ICD-10-CM | POA: Diagnosis not present

## 2018-09-05 DIAGNOSIS — H5213 Myopia, bilateral: Secondary | ICD-10-CM | POA: Diagnosis not present

## 2018-09-05 DIAGNOSIS — H524 Presbyopia: Secondary | ICD-10-CM | POA: Diagnosis not present

## 2018-09-08 ENCOUNTER — Ambulatory Visit
Admission: RE | Admit: 2018-09-08 | Discharge: 2018-09-08 | Disposition: A | Discharge status: Home / Self Care | Payer: Medicare Other | Source: Ambulatory Visit

## 2018-09-08 ENCOUNTER — Other Ambulatory Visit: Payer: Self-pay

## 2018-09-08 DIAGNOSIS — Z1231 Encounter for screening mammogram for malignant neoplasm of breast: Secondary | ICD-10-CM

## 2018-09-09 ENCOUNTER — Telehealth: Payer: Self-pay

## 2018-09-09 DIAGNOSIS — M85859 Other specified disorders of bone density and structure, unspecified thigh: Secondary | ICD-10-CM | POA: Insufficient documentation

## 2018-09-09 NOTE — Progress Notes (Signed)
Subjective:   Patient ID: Lisa Medina, female    DOB: 1950/12/17, 68 y.o.   MRN: [id number]  Lisa Medina is a pleasant 68 y.o. year old female who presents to clinic today with Follow-up (wants flu shot/ wants tdap/ no complaints today)  on 09/10/2018  HPI:  Kristopher Oppenheim, RN for medicare wellness visit earlier this morning.  Doing well.  Loves gardening, painting.   She is very physically active.  Doing weights and swimming three times per week.  Health Maintenance  Topic Date Due  . TETANUS/TDAP  10/13/1970  . INFLUENZA VACCINE  08/16/2018  . PNA vac Low Risk Adult (2 of 2 - PPSV23) 09/04/2018  . MAMMOGRAM  09/07/2020  . COLONOSCOPY  11/08/2022  . DEXA SCAN  Completed  . Hepatitis C Screening  Completed  mammogram done two days ago- 09/08/18- normal. No post menopausal bleeding.  Has a dermatologist.  HTN- started on BP meds several years ago- Benicar HCTZ. Started to feel dizzy when she stood from a seated position so she stopped taking it last year. BP remains well controlled off of rx. Denies HA, blurred vision, CP or SOB. BP Readings from Last 3 Encounters:  09/10/18 138/72  09/10/18 138/72  09/03/17 126/68   Lab Results  Component Value Date   CREATININE 0.96 10/02/2017   GERD- takes nexium 20 mg daily.  Situational anxiety- PDMP reviewed- I do not see that she has ever filled xanax rx.  She not longer feels she needs it as her mother passed away and she is no longer her caregiver.  She is doing well- she felt her mother was suffering at the end.  No flowsheet data found.  Depression screen [facility] 2/9 09/10/2018 01/24/2017 08/23/2016 06/21/2015 06/16/2014  Decreased Interest 0 0 0 0 0  Down, Depressed, Hopeless 0 0 0 0 0  PHQ - 2 Score 0 0 0 0 0   Osteopenia- DEXA from 10/28/17 showed osteopenia. ASSESSMENT: The BMD measured at Femur Neck Left is 0.841 g/cm2 with a T-score of -1.4. This patient is considered osteopenic according to World Health Organization [facility])  criteria.  The scan quality is good. L-2 and L-3 were excluded due to degenerative changes.  Site Region Measured Date Measured Age YA BMD Significant CHANGE T-score DualFemur Neck Left 10/28/2017 68 -1.4 0.841 g/cm2  AP Spine L1-L4 (L2,L3) 10/28/2017 68 0.3 1.198 g/cm2  DualFemur Total Mean 10/28/2017 68 -1.2 0.862 g/cm2  Lab Results  Component Value Date   CHOL 198 09/03/2017   HDL 69.80 09/03/2017   LDLCALC 110 (H) 09/03/2017   TRIG 88.0 09/03/2017   CHOLHDL 3 09/03/2017   Lab Results  Component Value Date   TSH 3.70 09/03/2017   Lab Results  Component Value Date   WBC 5.0 09/03/2017   HGB 13.9 09/03/2017   HCT 42.0 09/03/2017   MCV 92.2 09/03/2017   PLT 230.0 09/03/2017     Current Outpatient Medications on File Prior to Visit  Medication Sig Dispense Refill  . esomeprazole (NEXIUM) 20 MG capsule Take 20 mg by mouth daily at 12 noon.     No current facility-administered medications on file prior to visit.     Allergies  Allergen Reactions  . Macrobid [Nitrofurantoin] Swelling    Past Medical History:  Diagnosis Date  . GERD (gastroesophageal reflux disease)   . Hypertension   . Urinary tract bacterial infections     Past Surgical History:  Procedure Laterality Date  . COLONOSCOPY  2003, 2008  .  UPPER GI ENDOSCOPY      Family History  Problem Relation Age of Onset  . Hyperlipidemia Mother   . Stroke Mother   . Hypertension Mother   . Heart disease Father   . Hypertension Father   . Hypertension Brother   . Hypertension Maternal Grandmother   . Hypertension Maternal Grandfather   . Hypertension Paternal Grandmother   . Hypertension Paternal Grandfather   . Colon cancer Neg Hx   . Esophageal cancer Neg Hx   . Rectal cancer Neg Hx   . Stomach cancer Neg Hx   . Breast cancer Neg Hx     Social History   Socioeconomic History  . Marital status: Married    Spouse name: Not on file  . Number of children: Not on file  . Years of  education: Not on file  . Highest education level: Not on file  Occupational History  . Not on file  Social Needs  . Financial resource strain: Not on file  . Food insecurity    Worry: Not on file    Inability: Not on file  . Transportation needs    Medical: Not on file    Non-medical: Not on file  Tobacco Use  . Smoking status: Former Smoker    Quit date: 01/16/1992    Years since quitting: 26.6  . Smokeless tobacco: Never Used  Substance and Sexual Activity  . Alcohol use: Yes    Alcohol/week: 2.0 standard drinks    Types: 2 Glasses of wine per week  . Drug use: No  . Sexual activity: Yes  Lifestyle  . Physical activity    Days per week: Not on file    Minutes per session: Not on file  . Stress: Not on file  Relationships  . Social Musician on phone: Not on file    Gets together: Not on file    Attends religious service: Not on file    Active member of club or organization: Not on file    Attends meetings of clubs or organizations: Not on file    Relationship status: Not on file  . Intimate partner violence    Fear of current or ex partner: Not on file    Emotionally abused: Not on file    Physically abused: Not on file    Forced sexual activity: Not on file  Other Topics Concern  . Not on file  Social History Narrative   Full code- desires CPR but does not want prolonged life support if futile.   The PMH, PSH, Social History, Family History, Medications, and allergies have been reviewed in [facility], and have been updated if relevant.   Review of Systems  Constitutional: Negative.   HENT: Negative.   Eyes: Negative.   Respiratory: Negative.   Cardiovascular: Negative.   Gastrointestinal: Negative.   Endocrine: Negative.   Genitourinary: Negative.   Musculoskeletal: Negative.   Allergic/Immunologic: Negative.   Neurological: Negative.   Hematological: Negative.   Psychiatric/Behavioral: Negative.   All other systems reviewed and are negative.       Objective:    BP 138/72   Pulse (!) 57   Temp 98.1 F (36.7 C) (Oral)   Ht 5\' 9"  (1.753 m)   Wt 154 lb 6.4 oz (70 kg)   SpO2 98%   BMI 22.80 kg/m   Wt Readings from Last 3 Encounters:  09/10/18 154 lb 6.4 oz (70 kg)  09/10/18 154 lb 6.4 oz (70 kg)  09/03/17 150 lb (68 kg)     Physical Exam   General:  Well-developed,well-nourished,in no acute distress; alert,appropriate and cooperative throughout examination Head:  normocephalic and atraumatic.   Eyes:  vision grossly intact, PERRL Ears:  R ear normal and L ear normal externally, TMs clear bilaterally Nose:  no external deformity.   Mouth:  good dentition.   Neck:  No deformities, masses, or tenderness noted. Lungs:  Normal respiratory effort, chest expands symmetrically. Lungs are clear to auscultation, no crackles or wheezes. Heart:  Normal rate and regular rhythm. S1 and S2 normal without gallop, murmur, click, rub or other extra sounds. Abdomen:  Bowel sounds positive,abdomen soft and non-tender without masses, organomegaly or hernias noted. Msk:  No deformity or scoliosis noted of thoracic or lumbar spine.   Extremities:  No clubbing, cyanosis, edema, or deformity noted with normal full range of motion of all joints.   Neurologic:  alert & oriented X3 and gait normal.   Skin:  Intact without suspicious lesions or rashes Cervical Nodes:  No lymphadenopathy noted Axillary Nodes:  No palpable lymphadenopathy Psych:  Cognition and judgment appear intact. Alert and cooperative with normal attention span and concentration. No apparent delusions, illusions, hallucinations      Assessment & Plan:   Essential hypertension - Plan: CBC with Differential/Platelet, Comprehensive metabolic panel, Lipid panel, TSH  Gastroesophageal reflux disease without esophagitis  Post-menopausal  Osteopenia of neck of left femur - Plan: Vitamin D (25 hydroxy)  Need for influenza vaccination - Plan: Flu Vaccine QUAD 6+ mos PF IM  (Fluarix Quad PF)  Need for tetanus, diphtheria, and acellular pertussis (Tdap) vaccine - Plan: Tdap vaccine greater than or equal to 7yo IM No follow-ups on file.

## 2018-09-09 NOTE — Telephone Encounter (Signed)
Questions for Screening COVID-19  Symptom onset:n/a  Travel or Contacts: no  During this illness, did/does the patient experience any of the following symptoms? Fever >100.98F []   Yes [x]   No []   Unknown Subjective fever (felt feverish) []   Yes [x]   No []   Unknown Chills []   Yes [x]   No []   Unknown Muscle aches (myalgia) []   Yes [x]   No []   Unknown Runny nose (rhinorrhea) []   Yes [x]   No []   Unknown Sore throat []   Yes [x]   No []   Unknown Cough (new onset or worsening of chronic cough) []   Yes [x]   No []   Unknown Shortness of breath (dyspnea) []   Yes [x]   No []   Unknown Nausea or vomiting []   Yes [x]   No []   Unknown Headache []   Yes [x]   No []   Unknown Abdominal pain  []   Yes [x]   No []   Unknown Diarrhea (?3 loose/looser than normal stools/24hr period) []   Yes [x]   No []   Unknown Other, specify:  Patient risk factors: Smoker? []   Current []   Former []   Never If female, currently pregnant? []   Yes []   No  Patient Active Problem List   Diagnosis Date Noted  . Osteopenia of neck of femur 09/09/2018  . Situational anxiety 08/23/2016  . HTN (hypertension) 11/18/2013  . GERD (gastroesophageal reflux disease) 11/18/2013  . Post-menopausal 11/18/2013    Plan:  []   High risk for COVID-19 with red flags go to ED (with CP, SOB, weak/lightheaded, or fever > 101.5). Call ahead.  []   High risk for COVID-19 but stable. Inform provider and coordinate time for [facility] visit.   []   No red flags but URI signs or symptoms okay for [facility] visit.

## 2018-09-09 NOTE — Progress Notes (Signed)
Subjective:   Lisa Medina is a 68 y.o. female who presents for Medicare Annual (Subsequent) preventive examination.  Still working part time doing "tax" work. Works about 25hr/ month. Pt is very pleasant and energetic.   Review of Systems:   Home Safety/Smoke Alarms: Feels safe in home. Smoke alarms in place.  Lives with husband in 1 story home. 4 cats.  Female:   Mammo- 09/08/18      Dexa scan-  10/28/17      CCS- 11/07/12. 10 yr recall     Objective:     Vitals: BP 138/72 (BP Location: Left Arm, Patient Position: Sitting, Cuff Size: Normal)   Temp 98.1 F (36.7 C) (Oral)   Ht 5\' 9"  (1.753 m)   Wt 154 lb 6.4 oz (70 kg)   SpO2 98%   BMI 22.80 kg/m   Body mass index is 22.8 kg/m.  Advanced Directives 09/10/2018  Does Patient Have a Medical Advance Directive? No  Would patient like information on creating a medical advance directive? Yes (MAU/Ambulatory/Procedural Areas - Information given)    Tobacco Social History   Tobacco Use  Smoking Status Former Smoker  . Quit date: 01/16/1992  . Years since quitting: 26.6  Smokeless Tobacco Never Used     Counseling given: Not Answered   Clinical Intake: Pain : No/denies pain      Past Medical History:  Diagnosis Date  . GERD (gastroesophageal reflux disease)   . Hypertension   . Urinary tract bacterial infections    Past Surgical History:  Procedure Laterality Date  . COLONOSCOPY  2003, 2008  . UPPER GI ENDOSCOPY     Family History  Problem Relation Age of Onset  . Hyperlipidemia Mother   . Stroke Mother   . Hypertension Mother   . Heart disease Father   . Hypertension Father   . Hypertension Brother   . Hypertension Maternal Grandmother   . Hypertension Maternal Grandfather   . Hypertension Paternal Grandmother   . Hypertension Paternal Grandfather   . Colon cancer Neg Hx   . Esophageal cancer Neg Hx   . Rectal cancer Neg Hx   . Stomach cancer Neg Hx   . Breast cancer Neg Hx    Social  History   Socioeconomic History  . Marital status: Married    Spouse name: Not on file  . Number of children: Not on file  . Years of education: Not on file  . Highest education level: Not on file  Occupational History  . Not on file  Social Needs  . Financial resource strain: Not on file  . Food insecurity    Worry: Not on file    Inability: Not on file  . Transportation needs    Medical: Not on file    Non-medical: Not on file  Tobacco Use  . Smoking status: Former Smoker    Quit date: 01/16/1992    Years since quitting: 26.6  . Smokeless tobacco: Never Used  Substance and Sexual Activity  . Alcohol use: Yes    Alcohol/week: 2.0 standard drinks    Types: 2 Glasses of wine per week  . Drug use: No  . Sexual activity: Yes  Lifestyle  . Physical activity    Days per week: Not on file    Minutes per session: Not on file  . Stress: Not on file  Relationships  . Social Herbalist on phone: Not on file    Gets together: Not on  file    Attends religious service: Not on file    Active member of club or organization: Not on file    Attends meetings of clubs or organizations: Not on file    Relationship status: Not on file  Other Topics Concern  . Not on file  Social History Narrative   Full code- desires CPR but does not want prolonged life support if futile.    Outpatient Encounter Medications as of 09/10/2018  Medication Sig  . esomeprazole (NEXIUM) 20 MG capsule Take 20 mg by mouth daily at 12 noon.  . [DISCONTINUED] ALPRAZolam (XANAX) 0.25 MG tablet Take 1 tablet (0.25 mg total) by mouth 2 (two) times daily as needed for anxiety.   No facility-administered encounter medications on file as of 09/10/2018.     Activities of Daily Living In your present state of health, do you have any difficulty performing the following activities: 09/10/2018  Hearing? N  Vision? N  Difficulty concentrating or making decisions? N  Walking or climbing stairs? N  Dressing or  bathing? N  Doing errands, shopping? N  Preparing Food and eating ? N  Using the Toilet? N  In the past six months, have you accidently leaked urine? N  Do you have problems with loss of bowel control? N  Managing your Medications? N  Managing your Finances? N  Housekeeping or managing your Housekeeping? N  Some recent data might be hidden    Patient Care Team: Lucille Passy, MD as PCP - General (Family Medicine) Rolm Bookbinder, MD as Consulting Physician (Dermatology)    Assessment:   This is a routine wellness examination for Lisa Medina. Physical assessment deferred to PCP.  Exercise Activities and Dietary recommendations Current Exercise Habits: Home exercise routine, Type of exercise: strength training/weights(swimming), Time (Minutes): 45, Frequency (Times/Week): 4, Weekly Exercise (Minutes/Week): 180, Exercise limited by: None identified Diet (meal preparation, eat out, water intake, caffeinated beverages, dairy products, fruits and vegetables): 24 hr recall Breakfast: cereal and fruit Lunch: chicken salad and crackers Dinner:   Popcorn Needs to drink more water.  Goals    . Exercise 150 min/wk Moderate Activity     Continue exercising.       Fall Risk Fall Risk  09/10/2018 01/24/2017  Falls in the past year? 0 No    Depression Screen PHQ 2/9 Scores 09/10/2018 01/24/2017 08/23/2016 06/21/2015  PHQ - 2 Score 0 0 0 0     Cognitive Function  Ad8 score reviewed for issues:  Issues making decisions:no  Less interest in hobbies / activities:no  Repeats questions, stories (family complaining):no  Trouble using ordinary gadgets (microwave, computer, phone):no  Forgets the month or year: no  Mismanaging finances: no  Remembering appts:no  Daily problems with thinking and/or memory:no Ad8 score is=0        Immunization History  Administered Date(s) Administered  . Influenza,inj,Quad PF,6+ Mos 01/24/2017  . Influenza-Unspecified 10/15/2013  . Pneumococcal  Conjugate-13 09/03/2017  . Zoster 06/16/2014    Screening Tests Health Maintenance  Topic Date Due  . TETANUS/TDAP  10/13/1970  . INFLUENZA VACCINE  08/16/2018  . PNA vac Low Risk Adult (2 of 2 - PPSV23) 09/04/2018  . MAMMOGRAM  09/07/2020  . COLONOSCOPY  11/08/2022  . DEXA SCAN  Completed  . Hepatitis C Screening  Completed      Plan:   Keep up the great work!!!  See you next year!   I have personally reviewed and noted the following in the patient's chart:   .  Medical and social history . Use of alcohol, tobacco or illicit drugs  . Current medications and supplements . Functional ability and status . Nutritional status . Physical activity . Advanced directives . List of other physicians . Hospitalizations, surgeries, and ER visits in previous 12 months . Vitals . Screenings to include cognitive, depression, and falls . Referrals and appointments  In addition, I have reviewed and discussed with patient certain preventive protocols, quality metrics, and best practice recommendations. A written personalized care plan for preventive services as well as general preventive health recommendations were provided to patient.     Naaman Plummer [place], [place]  09/10/2018

## 2018-09-10 ENCOUNTER — Ambulatory Visit (INDEPENDENT_AMBULATORY_CARE_PROVIDER_SITE_OTHER): Payer: Medicare Other | Admitting: Family Medicine

## 2018-09-10 ENCOUNTER — Ambulatory Visit (INDEPENDENT_AMBULATORY_CARE_PROVIDER_SITE_OTHER): Payer: Medicare Other | Admitting: *Deleted

## 2018-09-10 ENCOUNTER — Encounter: Payer: Self-pay | Admitting: *Deleted

## 2018-09-10 ENCOUNTER — Other Ambulatory Visit: Payer: Self-pay

## 2018-09-10 ENCOUNTER — Encounter: Payer: Self-pay | Admitting: Family Medicine

## 2018-09-10 VITALS — BP 138/72 | Temp 98.1°F | Ht 69.0 in | Wt 154.4 lb

## 2018-09-10 VITALS — BP 138/72 | HR 57 | Temp 98.1°F | Ht 69.0 in | Wt 154.4 lb

## 2018-09-10 DIAGNOSIS — K219 Gastro-esophageal reflux disease without esophagitis: Secondary | ICD-10-CM | POA: Diagnosis not present

## 2018-09-10 DIAGNOSIS — M85852 Other specified disorders of bone density and structure, left thigh: Secondary | ICD-10-CM

## 2018-09-10 DIAGNOSIS — Z23 Encounter for immunization: Secondary | ICD-10-CM

## 2018-09-10 DIAGNOSIS — I10 Essential (primary) hypertension: Secondary | ICD-10-CM | POA: Diagnosis not present

## 2018-09-10 DIAGNOSIS — Z0000 Encounter for general adult medical examination without abnormal findings: Secondary | ICD-10-CM

## 2018-09-10 DIAGNOSIS — Z780 Asymptomatic menopausal state: Secondary | ICD-10-CM

## 2018-09-10 LAB — LIPID PANEL
Cholesterol: 183 mg/dL (ref 0–200)
HDL: 61.7 mg/dL (ref >39.00)
LDL Cholesterol: 105 mg/dL — ABNORMAL HIGH (ref 0–99)
NonHDL: 120.86
Total CHOL/HDL Ratio: 3
Triglycerides: 77 mg/dL (ref 0.0–149.0)
VLDL: 15.4 mg/dL (ref 0.0–40.0)

## 2018-09-10 LAB — VITAMIN D 25 HYDROXY (VIT D DEFICIENCY, FRACTURES): VITD: 32.2 ng/mL (ref 30.00–100.00)

## 2018-09-10 LAB — CBC WITH DIFFERENTIAL/PLATELET
Basophils Absolute: 0 10*3/uL (ref 0.0–0.1)
Basophils Relative: 0.6 % (ref 0.0–3.0)
Eosinophils Absolute: 0.2 10*3/uL (ref 0.0–0.7)
Eosinophils Relative: 2.9 % (ref 0.0–5.0)
HCT: 41.6 % (ref 36.0–46.0)
Hemoglobin: 13.8 g/dL (ref 12.0–15.0)
Lymphocytes Relative: 23 % (ref 12.0–46.0)
Lymphs Abs: 1.4 10*3/uL (ref 0.7–4.0)
MCHC: 33.2 g/dL (ref 30.0–36.0)
MCV: 93.7 fl (ref 78.0–100.0)
Monocytes Absolute: 0.5 10*3/uL (ref 0.1–1.0)
Monocytes Relative: 7.4 % (ref 3.0–12.0)
Neutro Abs: 4.1 10*3/uL (ref 1.4–7.7)
Neutrophils Relative %: 66.1 % (ref 43.0–77.0)
Platelets: 224 10*3/uL (ref 150.0–400.0)
RBC: 4.44 Mil/uL (ref 3.87–5.11)
RDW: 12.9 % (ref 11.5–15.5)
WBC: 6.2 10*3/uL (ref 4.0–10.5)

## 2018-09-10 LAB — COMPREHENSIVE METABOLIC PANEL
ALT: 17 U/L (ref 0–35)
AST: 19 U/L (ref 0–37)
Albumin: 4.3 g/dL (ref 3.5–5.2)
Alkaline Phosphatase: 78 U/L (ref 39–117)
BUN: 11 mg/dL (ref 6–23)
CO2: 30 mEq/L (ref 19–32)
Calcium: 9.3 mg/dL (ref 8.4–10.5)
Chloride: 106 mEq/L (ref 96–112)
Creatinine, Ser: 0.85 mg/dL (ref 0.40–1.20)
GFR: 66.73 mL/min (ref >60.00)
Glucose, Bld: 94 mg/dL (ref 70–99)
Potassium: 4.9 mEq/L (ref 3.5–5.1)
Sodium: 143 mEq/L (ref 135–145)
Total Bilirubin: 0.5 mg/dL (ref 0.2–1.2)
Total Protein: 6.7 g/dL (ref 6.0–8.3)

## 2018-09-10 LAB — TSH: TSH: 4.57 u[IU]/mL — ABNORMAL HIGH (ref 0.35–4.50)

## 2018-09-10 NOTE — Assessment & Plan Note (Signed)
Well controlled.  No changes made. 

## 2018-09-10 NOTE — Assessment & Plan Note (Signed)
Osteopenia- continue calcium, Vit D, weight bearing exercises.

## 2018-09-10 NOTE — Patient Instructions (Addendum)
Great to see you. I will call you with your lab results from today and you can view them online.   Calcium supplements have received some bad press lately, with questions that they may increase risk of heart attack or blood clots.  The risk is very low, however none of these risks occur with calcium in FOOD. Try to get most or all of your calcium from your food--aim for 1000 mg/day for women up to 71 and men up to 70 and 1200 mg/day for women over 79 and men over 70.  To figure out dietary calcium: 300 mg/day from all non dairy foods plus 300 mg per cup of milk, other dairy, or fortified juice.  Non dairy foods that contain calcium:  Kale, oranges, sardines, oatmeal, soy milk/soybeans, salmon, white beans, dried figs, turnip greens, almonds, broccoli, tofu.  Add Vitamin D 2000 IU daily.

## 2018-09-10 NOTE — Patient Instructions (Signed)
Keep up the great work!!!  See you next year!   Lisa Medina , Thank you for taking time to come for your Medicare Wellness Visit. I appreciate your ongoing commitment to your health goals. Please review the following plan we discussed and let me know if I can assist you in the future.   These are the goals we discussed: Goals    . Exercise 150 min/wk Moderate Activity     Continue exercising.       This is a list of the screening recommended for you and due dates:  Health Maintenance  Topic Date Due  . Tetanus Vaccine  10/13/1970  . Flu Shot  08/16/2018  . Pneumonia vaccines (2 of 2 - PPSV23) 09/04/2018  . Mammogram  09/07/2020  . Colon Cancer Screening  11/08/2022  . DEXA scan (bone density measurement)  Completed  .  Hepatitis C: One time screening is recommended by Center for Disease Control  (CDC) for  adults born from [birth year] through 1964.   Completed    Health Maintenance After Age 14 After age 55, you are at a higher risk for certain long-term diseases and infections as well as injuries from falls. Falls are a major cause of broken bones and head injuries in people who are older than age 37. Getting regular preventive care can help to keep you healthy and well. Preventive care includes getting regular testing and making lifestyle changes as recommended by your health care provider. Talk with your health care provider about:  Which screenings and tests you should have. A screening is a test that checks for a disease when you have no symptoms.  A diet and exercise plan that is right for you. What should I know about screenings and tests to prevent falls? Screening and testing are the best ways to find a health problem early. Early diagnosis and treatment give you the best chance of managing medical conditions that are common after age 2. Certain conditions and lifestyle choices may make you more likely to have a fall. Your health care provider may recommend:  Regular vision  checks. Poor vision and conditions such as cataracts can make you more likely to have a fall. If you wear glasses, make sure to get your prescription updated if your vision changes.  Medicine review. Work with your health care provider to regularly review all of the medicines you are taking, including over-the-counter medicines. Ask your health care provider about any side effects that may make you more likely to have a fall. Tell your health care provider if any medicines that you take make you feel dizzy or sleepy.  Osteoporosis screening. Osteoporosis is a condition that causes the bones to get weaker. This can make the bones weak and cause them to break more easily.  Blood pressure screening. Blood pressure changes and medicines to control blood pressure can make you feel dizzy.  Strength and balance checks. Your health care provider may recommend certain tests to check your strength and balance while standing, walking, or changing positions.  Foot health exam. Foot pain and numbness, as well as not wearing proper footwear, can make you more likely to have a fall.  Depression screening. You may be more likely to have a fall if you have a fear of falling, feel emotionally low, or feel unable to do activities that you used to do.  Alcohol use screening. Using too much alcohol can affect your balance and may make you more likely to have a fall.  What actions can I take to lower my risk of falls? General instructions  Talk with your health care provider about your risks for falling. Tell your health care provider if: ? You fall. Be sure to tell your health care provider about all falls, even ones that seem minor. ? You feel dizzy, sleepy, or off-balance.  Take over-the-counter and prescription medicines only as told by your health care provider. These include any supplements.  Eat a healthy diet and maintain a healthy weight. A healthy diet includes low-fat dairy products, low-fat (lean)  meats, and fiber from whole grains, beans, and lots of fruits and vegetables. Home safety  Remove any tripping hazards, such as rugs, cords, and clutter.  Install safety equipment such as grab bars in bathrooms and safety rails on stairs.  Keep rooms and walkways well-lit. Activity   Follow a regular exercise program to stay fit. This will help you maintain your balance. Ask your health care provider what types of exercise are appropriate for you.  If you need a cane or walker, use it as recommended by your health care provider.  Wear supportive shoes that have nonskid soles. Lifestyle  Do not drink alcohol if your health care provider tells you not to drink.  If you drink alcohol, limit how much you have: ? 0-1 drink a day for women. ? 0-2 drinks a day for men.  Be aware of how much alcohol is in your drink. In the [place] one drink equals one typical bottle of beer (12 oz), one-half glass of wine (5 oz), or one shot of hard liquor (1 oz).  Do not use any products that contain nicotine or tobacco, such as cigarettes and e-cigarettes. If you need help quitting, ask your health care provider. Summary  Having a healthy lifestyle and getting preventive care can help to protect your health and wellness after age 26.  Screening and testing are the best way to find a health problem early and help you avoid having a fall. Early diagnosis and treatment give you the best chance for managing medical conditions that are more common for people who are older than age 27.  Falls are a major cause of broken bones and head injuries in people who are older than age 7. Take precautions to prevent a fall at home.  Work with your health care provider to learn what changes you can make to improve your health and wellness and to prevent falls. This information is not intended to replace advice given to you by your health care provider. Make sure you discuss any questions you have with your health care  provider. Document Released: 11/14/2016 Document Revised: 04/24/2018 Document Reviewed: 11/14/2016 Elsevier Patient Education  2020 [employer].

## 2018-09-10 NOTE — Addendum Note (Signed)
Addended byShawnie Pons on: 09/10/2018 09:50 AM   Modules accepted: Orders

## 2018-09-10 NOTE — Assessment & Plan Note (Signed)
Doing well with nexium 

## 2018-09-29 DIAGNOSIS — D485 Neoplasm of uncertain behavior of skin: Secondary | ICD-10-CM | POA: Diagnosis not present

## 2018-09-29 DIAGNOSIS — L400 Psoriasis vulgaris: Secondary | ICD-10-CM | POA: Diagnosis not present

## 2018-09-29 DIAGNOSIS — R21 Rash and other nonspecific skin eruption: Secondary | ICD-10-CM | POA: Diagnosis not present

## 2018-09-29 DIAGNOSIS — L409 Psoriasis, unspecified: Secondary | ICD-10-CM | POA: Diagnosis not present

## 2018-11-10 ENCOUNTER — Encounter: Payer: Self-pay | Admitting: Family Medicine

## 2018-11-11 ENCOUNTER — Other Ambulatory Visit: Payer: Self-pay | Admitting: Family Medicine

## 2018-11-11 ENCOUNTER — Other Ambulatory Visit: Payer: Self-pay

## 2018-11-11 ENCOUNTER — Ambulatory Visit (INDEPENDENT_AMBULATORY_CARE_PROVIDER_SITE_OTHER): Payer: Medicare Other | Admitting: Family Medicine

## 2018-11-11 DIAGNOSIS — R350 Frequency of micturition: Secondary | ICD-10-CM | POA: Diagnosis not present

## 2018-11-11 DIAGNOSIS — R300 Dysuria: Secondary | ICD-10-CM | POA: Diagnosis not present

## 2018-11-11 LAB — POCT URINALYSIS DIPSTICK
Glucose, UA: NEGATIVE
Ketones, UA: NEGATIVE
Nitrite, UA: POSITIVE
Protein, UA: POSITIVE — AB
Spec Grav, UA: 1.025 (ref 1.010–1.025)
Urobilinogen, UA: 0.2 E.U./dL
pH, UA: 6 (ref 5.0–8.0)

## 2018-11-11 MED ORDER — CEPHALEXIN 500 MG PO CAPS: 500.0000 mg | ORAL_CAPSULE | Freq: Two times a day (BID) | ORAL | 0 refills | Status: DC

## 2018-11-11 NOTE — Progress Notes (Signed)
SUBJECTIVE: Lisa Medina is a 68 y.o. female who complains of urinary frequency, urgency and dysuria x days, without flank pain, fever, chills, or abnormal vaginal discharge or bleeding.   Current Outpatient Medications on File Prior to Visit  Medication Sig Dispense Refill  . esomeprazole (NEXIUM) 20 MG capsule Take 20 mg by mouth daily at 12 noon.     No current facility-administered medications on file prior to visit.     Allergies  Allergen Reactions  . Macrobid [Nitrofurantoin] Swelling    Past Medical History:  Diagnosis Date  . GERD (gastroesophageal reflux disease)   . Hypertension   . Urinary tract bacterial infections     Past Surgical History:  Procedure Laterality Date  . COLONOSCOPY  2003, 2008  . UPPER GI ENDOSCOPY      Family History  Problem Relation Age of Onset  . Hyperlipidemia Mother   . Stroke Mother   . Hypertension Mother   . Heart disease Father   . Hypertension Father   . Hypertension Brother   . Hypertension Maternal Grandmother   . Hypertension Maternal Grandfather   . Hypertension Paternal Grandmother   . Hypertension Paternal Grandfather   . Colon cancer Neg Hx   . Esophageal cancer Neg Hx   . Rectal cancer Neg Hx   . Stomach cancer Neg Hx   . Breast cancer Neg Hx     Social History   Socioeconomic History  . Marital status: Married    Spouse name: Not on file  . Number of children: Not on file  . Years of education: Not on file  . Highest education level: Not on file  Occupational History  . Not on file  Social Needs  . Financial resource strain: Not on file  . Food insecurity    Worry: Not on file    Inability: Not on file  . Transportation needs    Medical: Not on file    Non-medical: Not on file  Tobacco Use  . Smoking status: Former Smoker    Quit date: 01/16/1992    Years since quitting: 26.8  . Smokeless tobacco: Never Used  Substance and Sexual Activity  . Alcohol use: Yes    Alcohol/week: 2.0 standard drinks     Types: 2 Glasses of wine per week  . Drug use: No  . Sexual activity: Yes  Lifestyle  . Physical activity    Days per week: Not on file    Minutes per session: Not on file  . Stress: Not on file  Relationships  . Social Herbalist on phone: Not on file    Gets together: Not on file    Attends religious service: Not on file    Active member of club or organization: Not on file    Attends meetings of clubs or organizations: Not on file    Relationship status: Not on file  . Intimate partner violence    Fear of current or ex partner: Not on file    Emotionally abused: Not on file    Physically abused: Not on file    Forced sexual activity: Not on file  Other Topics Concern  . Not on file  Social History Narrative   Full code- desires CPR but does not want prolonged life support if futile.   The PMH, PSH, Social History, Family History, Medications, and allergies have been reviewed in [facility], and have been updated if relevant.  OBJECTIVE:  There were no vitals taken  for this visit.  Appears well, in no apparent distress.  Vital signs are normal. The abdomen is soft without tenderness, guarding, mass, rebound or organomegaly. No CVA tenderness or inguinal adenopathy noted. Urine dipstick  ASSESSMENT: UTI uncomplicated without evidence of pyelonephritis  PLAN: Treatment per orders - also push fluids, may use Pyridium OTC prn. Call or return to clinic prn if these symptoms worsen or fail to improve as anticipated.

## 2018-11-13 LAB — URINALYSIS, ROUTINE W REFLEX MICROSCOPIC
Bilirubin Urine: NEGATIVE
Glucose, UA: NEGATIVE
Hyaline Cast: NONE SEEN /LPF
Nitrite: POSITIVE — AB
Specific Gravity, Urine: 1.021 (ref 1.001–1.03)
Squamous Epithelial / HPF: NONE SEEN /HPF (ref <=5)
WBC, UA: >=60 /HPF — AB (ref 0–5)
pH: <=5 (ref 5.0–8.0)

## 2018-11-13 LAB — URINE CULTURE
MICRO NUMBER:: 1034947
SPECIMEN QUALITY:: ADEQUATE

## 2019-01-02 ENCOUNTER — Encounter: Payer: Self-pay | Admitting: Family Medicine

## 2019-02-02 ENCOUNTER — Encounter: Payer: Self-pay | Admitting: Family Medicine

## 2019-02-04 ENCOUNTER — Telehealth: Payer: Self-pay | Admitting: Family Medicine

## 2019-02-04 NOTE — Telephone Encounter (Signed)
Yes ok with me

## 2019-02-04 NOTE — Telephone Encounter (Signed)
Plz advise if you both are ok with the TOC to [facility]/thx dmf

## 2019-02-04 NOTE — Telephone Encounter (Signed)
Patient is calling to [facility] from Dr. Deborra Medina to Dr. Bryan Lemma due to Dr. Deborra Medina leaving the practice. Pls advise.

## 2019-02-06 NOTE — Telephone Encounter (Signed)
TB-Pt is needing to schedule an appt to [facility] to [facility] and she is in agreement/thx dmf

## 2019-02-12 DIAGNOSIS — Z03818 Encounter for observation for suspected exposure to other biological agents ruled out: Secondary | ICD-10-CM | POA: Diagnosis not present

## 2019-06-14 DIAGNOSIS — R350 Frequency of micturition: Secondary | ICD-10-CM | POA: Diagnosis not present

## 2019-06-14 DIAGNOSIS — N3001 Acute cystitis with hematuria: Secondary | ICD-10-CM | POA: Diagnosis not present

## 2019-08-19 ENCOUNTER — Other Ambulatory Visit: Payer: Self-pay | Admitting: Family Medicine

## 2019-08-19 DIAGNOSIS — Z1231 Encounter for screening mammogram for malignant neoplasm of breast: Secondary | ICD-10-CM

## 2019-08-31 ENCOUNTER — Encounter: Payer: Medicare Other | Admitting: Family Medicine

## 2019-09-14 ENCOUNTER — Other Ambulatory Visit: Payer: Self-pay

## 2019-09-14 ENCOUNTER — Ambulatory Visit (INDEPENDENT_AMBULATORY_CARE_PROVIDER_SITE_OTHER): Payer: Medicare Other | Admitting: Family Medicine

## 2019-09-14 ENCOUNTER — Ambulatory Visit
Admission: RE | Admit: 2019-09-14 | Discharge: 2019-09-14 | Disposition: A | Discharge status: Home / Self Care | Payer: Medicare Other | Source: Ambulatory Visit | Attending: Family Medicine | Admitting: Family Medicine

## 2019-09-14 ENCOUNTER — Encounter: Payer: Self-pay | Admitting: Family Medicine

## 2019-09-14 VITALS — BP 118/70 | HR 59 | Temp 98.1°F | Ht 69.0 in | Wt 156.0 lb

## 2019-09-14 DIAGNOSIS — N952 Postmenopausal atrophic vaginitis: Secondary | ICD-10-CM | POA: Diagnosis not present

## 2019-09-14 DIAGNOSIS — M85852 Other specified disorders of bone density and structure, left thigh: Secondary | ICD-10-CM

## 2019-09-14 DIAGNOSIS — N390 Urinary tract infection, site not specified: Secondary | ICD-10-CM | POA: Diagnosis not present

## 2019-09-14 DIAGNOSIS — Z1231 Encounter for screening mammogram for malignant neoplasm of breast: Secondary | ICD-10-CM

## 2019-09-14 DIAGNOSIS — K219 Gastro-esophageal reflux disease without esophagitis: Secondary | ICD-10-CM | POA: Diagnosis not present

## 2019-09-14 MED ORDER — ESTROGENS, CONJUGATED 0.625 MG/GM VA CREA: 1.0000 | TOPICAL_CREAM | VAGINAL | 1 refills | Status: DC

## 2019-09-14 NOTE — Assessment & Plan Note (Signed)
Reviewed Dexa 2019. Advised regular Vit D and Calcium and weight bearing exercise.

## 2019-09-14 NOTE — Progress Notes (Signed)
Subjective:     Lisa Medina is a 69 y.o. female presenting for Establish Care     HPI   #recurrent UTI - last UTI May 2021- was diagnosed online - [facility]  - Oct 2020 - was treated - initially did not respond to the medication and did a urine same which by report was positive and responded to another course of abx - does seem to have episodes AFTER intercourse - has not had sex since the spring   #painful intercourse - using lubricant - noticed this over the last few years  Review of Systems   Social History   Tobacco Use  Smoking Status Former Smoker  . Years: 15.00  . Quit date: 01/16/1992  . Years since quitting: 27.6  Smokeless Tobacco Never Used        Objective:    BP Readings from Last 3 Encounters:  09/14/19 118/70  09/10/18 138/72  09/10/18 138/72   Wt Readings from Last 3 Encounters:  09/14/19 156 lb (70.8 kg)  09/10/18 154 lb 6.4 oz (70 kg)  09/10/18 154 lb 6.4 oz (70 kg)    BP 118/70   Pulse (!) 59   Temp 98.1 F (36.7 C) (Temporal)   Ht 5\' 9"  (1.753 m)   Wt 156 lb (70.8 kg)   SpO2 96%   BMI 23.04 kg/m    Physical Exam Constitutional:      General: She is not in acute distress.    Appearance: She is well-developed. She is not diaphoretic.  HENT:     Right Ear: External ear normal.     Left Ear: External ear normal.  Eyes:     Conjunctiva/sclera: Conjunctivae normal.  Cardiovascular:     Rate and Rhythm: Normal rate.  Pulmonary:     Effort: Pulmonary effort is normal.  Musculoskeletal:     Cervical back: Neck supple.  Skin:    General: Skin is warm and dry.     Capillary Refill: Capillary refill takes less than 2 seconds.  Neurological:     Mental Status: She is alert. Mental status is at baseline.  Psychiatric:        Mood and Affect: Mood normal.        Behavior: Behavior normal.           Assessment & Plan:   Problem List Items Addressed This Visit      Digestive   GERD (gastroesophageal reflux  disease)    Encouraged reducing use nexium to as needed.         Musculoskeletal and Integument   Osteopenia of neck of femur    Reviewed Dexa 2019. Advised regular Vit D and Calcium and weight bearing exercise.         Genitourinary   Vaginal atrophy - Primary    Pt notes progressively worsening pain with intercourse and not responding to lubrication. Discussed trying topical estrogen. Will also see if this makes impact on UTI.       Relevant Medications   conjugated estrogens (PREMARIN) vaginal cream   Postcoital UTI    UCx available in epic do not show infection. Reports telehealth visit which was positive. Estrogen cream and advised returning. If culture proves UTI then could consider postcoital prophylaxis but would need to know about resistance patterns.           Return if symptoms worsen or fail to improve.  Lesleigh Noe, MD  This visit occurred during the SARS-CoV-2 public health  emergency.  Safety protocols were in place, including screening questions prior to the visit, additional usage of staff PPE, and extensive cleaning of exam room while observing appropriate contact time as indicated for disinfecting solutions.

## 2019-09-14 NOTE — Assessment & Plan Note (Signed)
Encouraged reducing use nexium to as needed.

## 2019-09-14 NOTE — Patient Instructions (Addendum)
Vaginal cream - can do daily for 21 days, stop for 7 days - then go to twice weekly   Just make an appointment if you get a urinary tract infection   Your DEXA showed osteopenia.   This means that she is at risk for developing osteoporosis and have some signs of low bone mass.   Would recommend the following:   1) at least 800 units of Vitamin D daily 2) Get 1200 mg of elemental calcium --- this is best from your diet. Try to track how much calcium you get on a typical day. You could find ways to add more (dairy products, leafy greens). Take a supplement for whatever you don't typically get so you reach 1200 mg of calcium.  3) Physical activity (ideally weight bearing) - like walking briskly 30 minutes 5 days a week.   We can plan to repeat in 3-5 years

## 2019-09-14 NOTE — Assessment & Plan Note (Signed)
Pt notes progressively worsening pain with intercourse and not responding to lubrication. Discussed trying topical estrogen. Will also see if this makes impact on UTI.

## 2019-09-14 NOTE — Assessment & Plan Note (Signed)
UCx available in epic do not show infection. Reports telehealth visit which was positive. Estrogen cream and advised returning. If culture proves UTI then could consider postcoital prophylaxis but would need to know about resistance patterns.

## 2019-10-07 DIAGNOSIS — Z23 Encounter for immunization: Secondary | ICD-10-CM | POA: Diagnosis not present

## 2019-10-12 ENCOUNTER — Telehealth: Payer: Self-pay | Admitting: Family Medicine

## 2019-10-12 NOTE — Telephone Encounter (Signed)
LVM for pt to rtn my call to R/S appt with [facility]. 

## 2019-10-13 ENCOUNTER — Encounter: Payer: Self-pay | Admitting: Family Medicine

## 2019-10-19 DIAGNOSIS — H52223 Regular astigmatism, bilateral: Secondary | ICD-10-CM | POA: Diagnosis not present

## 2019-10-19 DIAGNOSIS — H04123 Dry eye syndrome of bilateral lacrimal glands: Secondary | ICD-10-CM | POA: Diagnosis not present

## 2019-10-19 DIAGNOSIS — H524 Presbyopia: Secondary | ICD-10-CM | POA: Diagnosis not present

## 2019-10-19 DIAGNOSIS — H5213 Myopia, bilateral: Secondary | ICD-10-CM | POA: Diagnosis not present

## 2019-11-02 DIAGNOSIS — Z23 Encounter for immunization: Secondary | ICD-10-CM | POA: Diagnosis not present

## 2019-11-16 ENCOUNTER — Ambulatory Visit: Payer: Medicare Other

## 2019-11-23 ENCOUNTER — Other Ambulatory Visit: Payer: Self-pay

## 2019-11-23 ENCOUNTER — Encounter: Payer: Self-pay | Admitting: Family Medicine

## 2019-11-23 ENCOUNTER — Ambulatory Visit (INDEPENDENT_AMBULATORY_CARE_PROVIDER_SITE_OTHER): Payer: Medicare Other | Admitting: Family Medicine

## 2019-11-23 VITALS — BP 140/82 | HR 54 | Temp 97.7°F | Ht 67.75 in | Wt 156.8 lb

## 2019-11-23 DIAGNOSIS — E782 Mixed hyperlipidemia: Secondary | ICD-10-CM | POA: Diagnosis not present

## 2019-11-23 DIAGNOSIS — Z0000 Encounter for general adult medical examination without abnormal findings: Secondary | ICD-10-CM | POA: Diagnosis not present

## 2019-11-23 DIAGNOSIS — R7989 Other specified abnormal findings of blood chemistry: Secondary | ICD-10-CM | POA: Diagnosis not present

## 2019-11-23 DIAGNOSIS — I10 Essential (primary) hypertension: Secondary | ICD-10-CM

## 2019-11-23 DIAGNOSIS — E038 Other specified hypothyroidism: Secondary | ICD-10-CM | POA: Diagnosis not present

## 2019-11-23 LAB — COMPREHENSIVE METABOLIC PANEL
ALT: 29 U/L (ref 0–35)
AST: 24 U/L (ref 0–37)
Albumin: 4.1 g/dL (ref 3.5–5.2)
Alkaline Phosphatase: 75 U/L (ref 39–117)
BUN: 14 mg/dL (ref 6–23)
CO2: 31 mEq/L (ref 19–32)
Calcium: 9.2 mg/dL (ref 8.4–10.5)
Chloride: 103 mEq/L (ref 96–112)
Creatinine, Ser: 0.95 mg/dL (ref 0.40–1.20)
GFR: 61.73 mL/min (ref >60.00)
Glucose, Bld: 86 mg/dL (ref 70–99)
Potassium: 4.2 mEq/L (ref 3.5–5.1)
Sodium: 140 mEq/L (ref 135–145)
Total Bilirubin: 0.7 mg/dL (ref 0.2–1.2)
Total Protein: 6.8 g/dL (ref 6.0–8.3)

## 2019-11-23 LAB — T4, FREE: Free T4: 0.72 ng/dL (ref 0.60–1.60)

## 2019-11-23 LAB — LIPID PANEL
Cholesterol: 192 mg/dL (ref 0–200)
HDL: 68.5 mg/dL (ref >39.00)
LDL Cholesterol: 109 mg/dL — ABNORMAL HIGH (ref 0–99)
NonHDL: 123.58
Total CHOL/HDL Ratio: 3
Triglycerides: 75 mg/dL (ref 0.0–149.0)
VLDL: 15 mg/dL (ref 0.0–40.0)

## 2019-11-23 LAB — CBC
HCT: 41.6 % (ref 36.0–46.0)
Hemoglobin: 13.8 g/dL (ref 12.0–15.0)
MCHC: 33.3 g/dL (ref 30.0–36.0)
MCV: 89.2 fl (ref 78.0–100.0)
Platelets: 237 10*3/uL (ref 150.0–400.0)
RBC: 4.66 Mil/uL (ref 3.87–5.11)
RDW: 13 % (ref 11.5–15.5)
WBC: 5 10*3/uL (ref 4.0–10.5)

## 2019-11-23 LAB — TSH: TSH: 4.86 u[IU]/mL — ABNORMAL HIGH (ref 0.35–4.50)

## 2019-11-23 NOTE — Patient Instructions (Signed)
Get your dexa scan next year with your mammogram

## 2019-11-23 NOTE — Progress Notes (Signed)
Subjective:   Lisa Medina is a 69 y.o. female who presents for Medicare Annual (Subsequent) preventive examination.  Review of Systems    Review of Systems  Constitutional: Negative for chills and fever.  HENT: Negative for congestion and sore throat.   Eyes: Negative for blurred vision and double vision.  Respiratory: Negative for shortness of breath.   Cardiovascular: Negative for chest pain.  Gastrointestinal: Negative for heartburn, nausea and vomiting.  Genitourinary: Negative.   Musculoskeletal: Negative.  Negative for myalgias.  Skin: Negative for rash.  Neurological: Negative for dizziness and headaches.  Endo/Heme/Allergies: Does not bruise/bleed easily.  Psychiatric/Behavioral: Negative for depression. The patient is not nervous/anxious.     Cardiac Risk Factors include: advanced age (>16men, >67 women);hypertension;dyslipidemia     Objective:    Today's Vitals   11/23/19 0819  BP: 140/82  Pulse: (!) 54  Temp: 97.7 F (36.5 C)  TempSrc: Temporal  SpO2: 99%  Weight: 156 lb 12 oz (71.1 kg)  Height: 5' 7.75" (1.721 m)   Body mass index is 24.01 kg/m.  Advanced Directives 11/23/2019 09/10/2018  Does Patient Have a Medical Advance Directive? No No  Would patient like information on creating a medical advance directive? Yes (MAU/Ambulatory/Procedural Areas - Information given) Yes (MAU/Ambulatory/Procedural Areas - Information given)    Current Medications (verified) Outpatient Encounter Medications as of 11/23/2019  Medication Sig  . Ca Phosphate-Cholecalciferol (CALCIUM 500 + D3) 250-500 MG-UNIT CHEW Chew by mouth daily.  Marland Kitchen conjugated estrogens (PREMARIN) vaginal cream Place 1 Applicatorful vaginally 2 (two) times a week.  . esomeprazole (NEXIUM) 20 MG capsule Take 20 mg by mouth daily at 12 noon.   No facility-administered encounter medications on file as of 11/23/2019.    Allergies (verified) Macrobid [nitrofurantoin]   History: Past Medical  History:  Diagnosis Date  . GERD (gastroesophageal reflux disease)   . Hypertension   . Urinary tract bacterial infections    Past Surgical History:  Procedure Laterality Date  . COLONOSCOPY  2003, 2008  . UPPER GI ENDOSCOPY     Family History  Problem Relation Age of Onset  . Hyperlipidemia Mother   . Stroke Mother 33  . Hypertension Mother   . Heart disease Father   . Hypertension Father   . Hypertension Brother   . Hypertension Maternal Grandmother   . Hypertension Maternal Grandfather   . Hypertension Paternal Grandmother   . Hypertension Paternal Grandfather   . Colon cancer Neg Hx   . Esophageal cancer Neg Hx   . Rectal cancer Neg Hx   . Stomach cancer Neg Hx   . Breast cancer Neg Hx    Social History   Socioeconomic History  . Marital status: Married    Spouse name: [name]  . Number of children: 0  . Years of education: some college  . Highest education level: Not on file  Occupational History  . Not on file  Tobacco Use  . Smoking status: Former Smoker    Years: 15.00    Quit date: 01/16/1992    Years since quitting: 27.8  . Smokeless tobacco: Never Used  Vaping Use  . Vaping Use: Never used  Substance and Sexual Activity  . Alcohol use: Yes    Alcohol/week: 2.0 standard drinks    Types: 2 Glasses of wine per week    Comment: 4 times a week, 1 glass  . Drug use: No  . Sexual activity: Yes    Birth control/protection: Post-menopausal  Other Topics  Concern  . Not on file  Social History Narrative   Full code- desires CPR but does not want prolonged life support if futile.   09/14/19   From: [place]    Living: with husband, [name] (1997, but together since 1985)   Work: retired, works part-time - Press photographer and use taxes      Family: brother in [place] and brother in [place]      Enjoys: exercise      Exercise: working out 3 times a week- weights and swimming   Diet: fair - breakfast, lunch, and light dinner      Safety   Seat  belts: Yes    Guns: No   Safe in relationships: Yes    Social Determinants of Radio broadcast assistant Strain:   . Difficulty of Paying Living Expenses: Not on file  Food Insecurity:   . Worried About Charity fundraiser in the Last Year: Not on file  . Ran Out of Food in the Last Year: Not on file  Transportation Needs:   . Lack of Transportation (Medical): Not on file  . Lack of Transportation (Non-Medical): Not on file  Physical Activity:   . Days of Exercise per Week: Not on file  . Minutes of Exercise per Session: Not on file  Stress:   . Feeling of Stress : Not on file  Social Connections:   . Frequency of Communication with Friends and Family: Not on file  . Frequency of Social Gatherings with Friends and Family: Not on file  . Attends Religious Services: Not on file  . Active Member of Clubs or Organizations: Not on file  . Attends Archivist Meetings: Not on file  . Marital Status: Not on file    Tobacco Counseling Counseling given: Not Answered   Clinical Intake:  Pre-visit preparation completed: Yes  Pain : No/denies pain     Nutritional Status: BMI of 19-24  Normal Diabetes: No  How often do you need to have someone help you when you read instructions, pamphlets, or other written materials from your doctor or pharmacy?: 1 - Never What is the last grade level you completed in school?: some college  Diabetic?no  Interpreter Needed?: No      Activities of Daily Living In your present state of health, do you have any difficulty performing the following activities: 11/23/2019  Hearing? N  Vision? N  Difficulty concentrating or making decisions? Y  Comment remembering  Walking or climbing stairs? N  Dressing or bathing? N  Doing errands, shopping? N  Preparing Food and eating ? N  Using the Toilet? N  In the past six months, have you accidently leaked urine? N  Do you have problems with loss of bowel control? N  Managing your  Medications? N  Managing your Finances? N  Housekeeping or managing your Housekeeping? N  Some recent data might be hidden    Patient Care Team: Lesleigh Noe, MD as PCP - General (Family Medicine) Rolm Bookbinder, MD as Consulting Physician (Dermatology)  Indicate any recent Medical Services you may have received from other than [facility] providers in the past year (date may be approximate).     Assessment:   This is a routine wellness examination for Sharnee.  Hearing/Vision screen  Hearing Screening   125Hz  250Hz  500Hz  1000Hz  2000Hz  3000Hz  4000Hz  6000Hz  8000Hz   Right ear:  20 20 20 20  20     Left ear:  20 20 20 20   20  Vision Screening Comments: Last Eye exam in October 2021 at [facility] care   Dietary issues and exercise activities discussed: Current Exercise Habits: Home exercise routine, Type of exercise: strength training/weights;walking, Time (Minutes): > 60, Frequency (Times/Week): 3, Weekly Exercise (Minutes/Week): 0, Intensity: Moderate, Exercise limited by: None identified  Goals    . Exercise 150 min/wk Moderate Activity     Continue exercising.    . Weight (lb) < 145 lb (65.8 kg)     Exercise and healthy eating      Depression Screen PHQ 2/9 Scores 11/23/2019 09/14/2019 09/10/2018 01/24/2017 08/23/2016 06/21/2015 06/16/2014  PHQ - 2 Score 1 0 0 0 0 0 0  PHQ- 9 Score 3 0 - - - - -    Fall Risk Fall Risk  11/23/2019 09/14/2019 09/10/2018 01/24/2017  Falls in the past year? 0 0 0 No  Number falls in past yr: 0 0 - -  Injury with Fall? - 0 - -  Follow up - Falls evaluation completed - -    Any stairs in or around the home? no If so, are there any without handrails? Yes  Home free of loose throw rugs in walkways, pet beds, electrical cords, etc? Yes  Adequate lighting in your home to reduce risk of falls? Yes   TIMED UP AND GO:  Was the test performed? No .  Length of time to ambulate 10 feet:  Gait steady and fast without use of assistive device  Cognitive  Function:       Mini-Cog - 11/23/19 0842    Normal clock drawing test? yes    How many words correct? 3              Immunizations Immunization History  Administered Date(s) Administered  . Influenza,inj,Quad PF,6+ Mos 01/24/2017, 09/10/2018  . Influenza-Unspecified 10/15/2013, 10/07/2019  . PFIZER SARS-COV-2 Vaccination 02/25/2019, 03/18/2019  . Pneumococcal Conjugate-13 09/03/2017  . Pneumococcal Polysaccharide-23 09/10/2018  . Tdap 09/10/2018  . Zoster 06/16/2014    TDAP status: Up to date Flu Vaccine status: Up to date Pneumococcal vaccine status: Up to date Covid-19 vaccine status: Completed vaccines  Qualifies for Shingles Vaccine? Yes   Zostavax completed Yes   Shingrix Completed?: No.    Education has been provided regarding the importance of this vaccine. Patient has been advised to call insurance company to determine out of pocket expense if they have not yet received this vaccine. Advised may also receive vaccine at local pharmacy or Health Dept. Verbalized acceptance and understanding.  Screening Tests Health Maintenance  Topic Date Due  . MAMMOGRAM  09/13/2021  . COLONOSCOPY  11/08/2022  . TETANUS/TDAP  09/09/2028  . INFLUENZA VACCINE  Completed  . DEXA SCAN  Completed  . COVID-19 Vaccine  Completed  . Hepatitis C Screening  Completed  . PNA vac Low Risk Adult  Completed    Health Maintenance   There are no preventive care reminders to display for this patient.  Colorectal cancer screening: Completed 2014. Repeat every 10 years Mammogram status: Completed 08/2019. Repeat every year Bone Density status: Completed 2019. Results reflect: Bone density results: OSTEOPENIA. Repeat every 3 years.  Lung Cancer Screening: (Low Dose CT Chest recommended if Age 49-80 years, 30 pack-year currently smoking OR have quit w/in 15years.) does not qualify.   Lung Cancer Screening Referral: n/a  Additional Screening:  Hepatitis C Screening: does qualify;  Completed yes  Vision Screening: Recommended annual ophthalmology exams for early detection of glaucoma and other disorders of the eye.  Is the patient up to date with their annual eye exam?  Yes    Dental Screening: Recommended annual dental exams for proper oral hygiene  Community Resource Referral / Chronic Care Management: CRR required this visit?  No   CCM required this visit?  No      Plan:     I have personally reviewed and noted the following in the patient's chart:   . Medical and social history . Use of alcohol, tobacco or illicit drugs  . Current medications and supplements . Functional ability and status . Nutritional status . Physical activity . Advanced directives . List of other physicians . Hospitalizations, surgeries, and ER visits in previous 12 months . Vitals . Screenings to include cognitive, depression, and falls . Referrals and appointments  In addition, I have reviewed and discussed with patient certain preventive protocols, quality metrics, and best practice recommendations. A written personalized care plan for preventive services as well as general preventive health recommendations were provided to patient.     Lesleigh Noe, MD   11/23/2019

## 2019-11-24 LAB — T3: T3, Total: 119 ng/dL (ref 76–181)

## 2019-11-24 MED ORDER — ATORVASTATIN CALCIUM 10 MG PO TABS: 10.0000 mg | ORAL_TABLET | Freq: Every day | ORAL | 3 refills | Status: DC

## 2019-11-24 NOTE — Telephone Encounter (Signed)
Pt would like a call regarding labs and meds

## 2019-11-26 NOTE — Addendum Note (Signed)
Addended by: Waunita Schooner R on: 11/26/2019 12:55 PM   Modules accepted: Orders

## 2020-01-05 ENCOUNTER — Encounter: Payer: Self-pay | Admitting: Family Medicine

## 2020-02-29 ENCOUNTER — Encounter: Payer: Self-pay | Admitting: Family Medicine

## 2020-03-01 ENCOUNTER — Encounter: Payer: Self-pay | Admitting: Family Medicine

## 2020-03-01 ENCOUNTER — Other Ambulatory Visit: Payer: Self-pay

## 2020-03-01 ENCOUNTER — Ambulatory Visit (INDEPENDENT_AMBULATORY_CARE_PROVIDER_SITE_OTHER): Payer: Medicare Other | Admitting: Family Medicine

## 2020-03-01 VITALS — BP 124/72 | HR 61 | Temp 96.8°F | Resp 16 | Wt 158.5 lb

## 2020-03-01 DIAGNOSIS — G4700 Insomnia, unspecified: Secondary | ICD-10-CM | POA: Insufficient documentation

## 2020-03-01 DIAGNOSIS — F419 Anxiety disorder, unspecified: Secondary | ICD-10-CM | POA: Diagnosis not present

## 2020-03-01 MED ORDER — TRAZODONE HCL 50 MG PO TABS: 25.0000 mg | ORAL_TABLET | Freq: Every evening | ORAL | 3 refills | Status: DC | PRN

## 2020-03-01 NOTE — Patient Instructions (Signed)
#  Insomnia - try Trazodone 25-50 mg for sleep - ok to take for long periods of time  #anxiety - Ok to take expired xanax - if you need regularly - we may want to consider a more long term option - call or mychart if you run out  How to help anxiety - without medication.   1) Regular Exercise - walking, jogging, cycling, dancing, strength training --> Yoga has been shown in research to reduce depression and anxiety -- with even just one hour long session per week  2)  Begin a Mindfulness/Meditation practice -- this can take a little as 3 minutes and is helpful for all kinds of mood issues -- You can find resources in books -- Or you can download apps like  ---- Headspace App (which currently has free content called "Weathering the Storm") ---- Calm (which has a few free options)  ---- Insignt Timer ---- Stop, Breathe & Think  # With each of these Apps - you should decline the "start free trial" offer and as you search through the App should be able to access some of their free content. You can also chose to pay for the content if you find one that works well for you.   # Many of them also offer sleep specific content which may help with insomnia  3) Healthy Diet -- Avoid or decrease Caffeine -- Avoid or decrease Alcohol -- Drink plenty of water, have a balanced diet -- Avoid cigarettes and marijuana (as well as other recreational drugs)  4) Consider contacting a professional therapist  -- [facility] is one option. Call [phone] -- Or you can check out www.psychologytoday.com -- you can read bios of therapists and see if they accept insurance -- Check with your insurance to see if you have coverage and who may take your insurance

## 2020-03-01 NOTE — Assessment & Plan Note (Signed)
Well controlled. Cont prn xanax. Discussed if worsening or needing more frequently would need to consider alternative. Has around 10 pills from a #30 prescription from 2018.

## 2020-03-01 NOTE — Assessment & Plan Note (Signed)
Responds to rare use of trazodone 25-50 mg. Refill trazodone 50 mg. Ok to use long term if needed.

## 2020-03-01 NOTE — Progress Notes (Signed)
Subjective:     Lisa Medina is a 70 y.o. female presenting for Anxiety (Dr Deborra Medina prescribed patient Xanax in the past. Needs to address this.) and Insomnia (Use to take Trazodone prescribed by her gynecologist 100 mg 1/2 tablet, still has RX from 2004)     HPI   #Anxiety - at baseline is usually  - every few years gets overwhelmed but life stressors - had xanax prescribed in 2018 - the medication is still effective - got #30 pills in 2018 - has not needed medication  #insomnia - prescribed by GYN several years ago - anxiety seems to be worse at night - prescribed 50 mg trazodone - can take 25 mg which will help her sleep   Review of Systems  02/29/20: MyChart - sleep and anxiety - trazodone and xanax in the past  Social History   Tobacco Use  Smoking Status Former Smoker  . Years: 15.00  . Quit date: 01/16/1992  . Years since quitting: 28.1  Smokeless Tobacco Never Used        Objective:    BP Readings from Last 3 Encounters:  03/01/20 124/72  11/23/19 140/82  09/14/19 118/70   Wt Readings from Last 3 Encounters:  03/01/20 158 lb 8 oz (71.9 kg)  11/23/19 156 lb 12 oz (71.1 kg)  09/14/19 156 lb (70.8 kg)    BP 124/72   Pulse 61   Temp (!) 96.8 F (36 C)   Resp 16   Wt 158 lb 8 oz (71.9 kg)   SpO2 100%   BMI 24.28 kg/m    Physical Exam Constitutional:      General: She is not in acute distress.    Appearance: She is well-developed. She is not diaphoretic.  HENT:     Right Ear: External ear normal.     Left Ear: External ear normal.     Nose: Nose normal.  Eyes:     Conjunctiva/sclera: Conjunctivae normal.  Cardiovascular:     Rate and Rhythm: Normal rate.  Pulmonary:     Effort: Pulmonary effort is normal.  Musculoskeletal:     Cervical back: Neck supple.  Skin:    General: Skin is warm and dry.     Capillary Refill: Capillary refill takes less than 2 seconds.  Neurological:     Mental Status: She is alert. Mental status is at  baseline.  Psychiatric:        Mood and Affect: Mood normal.        Behavior: Behavior normal.           Assessment & Plan:   Problem List Items Addressed This Visit      Other   Anxiety - Primary    Well controlled. Cont prn xanax. Discussed if worsening or needing more frequently would need to consider alternative. Has around 10 pills from a #30 prescription from 2018.       Relevant Medications   traZODone (DESYREL) 50 MG tablet   ALPRAZolam (XANAX) 0.25 MG tablet   Insomnia    Responds to rare use of trazodone 25-50 mg. Refill trazodone 50 mg. Ok to use long term if needed.       Relevant Medications   traZODone (DESYREL) 50 MG tablet       Return if symptoms worsen or fail to improve.  Lesleigh Noe, MD  This visit occurred during the SARS-CoV-2 public health emergency.  Safety protocols were in place, including screening questions prior to the visit,  additional usage of staff PPE, and extensive cleaning of exam room while observing appropriate contact time as indicated for disinfecting solutions.

## 2020-05-03 ENCOUNTER — Encounter: Payer: Self-pay | Admitting: Family Medicine

## 2020-05-03 DIAGNOSIS — L820 Inflamed seborrheic keratosis: Secondary | ICD-10-CM | POA: Diagnosis not present

## 2020-05-03 DIAGNOSIS — L570 Actinic keratosis: Secondary | ICD-10-CM | POA: Diagnosis not present

## 2020-05-10 ENCOUNTER — Other Ambulatory Visit: Payer: Self-pay

## 2020-05-10 ENCOUNTER — Ambulatory Visit (INDEPENDENT_AMBULATORY_CARE_PROVIDER_SITE_OTHER): Payer: Medicare Other | Admitting: Family Medicine

## 2020-05-10 ENCOUNTER — Encounter: Payer: Self-pay | Admitting: Family Medicine

## 2020-05-10 VITALS — BP 122/80 | HR 49 | Temp 97.7°F | Ht 68.0 in | Wt 150.8 lb

## 2020-05-10 DIAGNOSIS — F419 Anxiety disorder, unspecified: Secondary | ICD-10-CM

## 2020-05-10 MED ORDER — LORAZEPAM 0.5 MG PO TABS: 0.2500 mg | ORAL_TABLET | Freq: Two times a day (BID) | ORAL | 0 refills | Status: DC | PRN

## 2020-05-10 MED ORDER — ESCITALOPRAM OXALATE 5 MG PO TABS
ORAL_TABLET | ORAL | 1 refills | Status: DC
Start: 2020-05-10 — End: 2020-05-16

## 2020-05-10 NOTE — Patient Instructions (Signed)
You are going to start a new antidepressant medication.   One of the risks of this medication is increase in suicidal thoughts.   Your suicide Action plan is as follows:  1) Call family member 2) Call the Suicide Hotline [phone] which is available 24 hours 3) Call the Clinic    The most common side effect is stomach upset. If this happens it means the medication is working. It should get better in 1-3 weeks.   Medication for depression and anxiety often takes 6-8 weeks to have a noticeable difference so stick with it. Also the best way for recovery is taking medication and seeing a therapist -- this is so important.     4) Find a therapist  -- [facility] one option. Call [phone] -- Or you can check out www.psychologytoday.com -- you can read bios of therapists and see if they accept insurance -- Check with your insurance to see if you have coverage and who may take your insurance  [place] location:   [facility] [phone]-  -Counseling/therapy (14 years and up- Medicaid insurance only)   -Psychiatry services most insurances accepted-treat and evaluate/diagnose patients and prescribe medications.  -Medication Management 69 years old to 29 years.  -Diagnoses treated: Depression/anxiety, ADHD, Substance Abuse, Bipolar Disorder, Psychotherapy, Pain Management, Spiritual Care Services.  [facility] (36 years of age and older) [phone]- Psychology services/therapy only.  [facility] [phone]- All ages-Just Therapy services   [facility] [phone]- [place] [facility]-counseling only.   [facility] [phone]- All ages-Psychology and Psychiatrist services (evaluate, treat, diagnose, prescribe medication) Treat all the diagnoses that would fall under Behavioral issues.   For new patients, on Monday, Wednesday and Friday from 8 am to 3 pm patient would just walk in and fill  out paperwork and they would get patient enrolled in the services they need based on their answers.   [facility] [phone]- All ages. Monday through Friday-9am to 4 pm walk in times for patients to come in and be evaluated. Medication Management only at this time. No therapy available.   [place] location:   [facility] Address: [address], [place] [address] Phone: [phone] Counseling and psychiatry services.   [facility] [place] and [place] locations)- [phone]- all ages, only therapy/counseling services/psychological evaluations. Services: aptitude testing, academic achievement testing, learning Disability evaluation, ADHD evaluations, psycho-educational evaluations, readiness for kindergarten Evaluations.   [place] location only has therapy services right now  [facility] [phone] services only. Works off [address] in [place].

## 2020-05-10 NOTE — Progress Notes (Signed)
Subjective:     Lisa Medina is a 70 y.o. female presenting for Medication Management (For anxiety )     HPI  #anxiety - early on the xanax was helping - was typically as needed  - is waking up at 4-5 am and having a hard time falling back a asleep - this is when she she feels sleepy - not sure if she feels more agitated  - exercising - doing mediation - does feel she is starting to experience some depression   Review of Systems   Social History   Tobacco Use  Smoking Status Former Smoker  . Years: 15.00  . Quit date: 01/16/1992  . Years since quitting: 28.3  Smokeless Tobacco Never Used        Objective:    BP Readings from Last 3 Encounters:  05/10/20 122/80  03/01/20 124/72  11/23/19 140/82   Wt Readings from Last 3 Encounters:  05/10/20 150 lb 12 oz (68.4 kg)  03/01/20 158 lb 8 oz (71.9 kg)  11/23/19 156 lb 12 oz (71.1 kg)    BP 122/80   Pulse (!) 49   Temp 97.7 F (36.5 C) (Temporal)   Ht 5\' 8"  (1.727 m)   Wt 150 lb 12 oz (68.4 kg)   SpO2 100%   BMI 22.92 kg/m    Physical Exam Constitutional:      General: She is not in acute distress.    Appearance: She is well-developed. She is not diaphoretic.  HENT:     Right Ear: External ear normal.     Left Ear: External ear normal.     Nose: Nose normal.  Eyes:     Conjunctiva/sclera: Conjunctivae normal.  Cardiovascular:     Rate and Rhythm: Normal rate.  Pulmonary:     Effort: Pulmonary effort is normal.  Musculoskeletal:     Cervical back: Neck supple.  Skin:    General: Skin is warm and dry.     Capillary Refill: Capillary refill takes less than 2 seconds.  Neurological:     Mental Status: She is alert. Mental status is at baseline.  Psychiatric:        Mood and Affect: Mood normal.        Behavior: Behavior normal.    GAD 7 : Generalized Anxiety Score 05/10/2020 03/01/2020 09/14/2019  Nervous, Anxious, on Edge 2 2 0  Control/stop worrying 1 3 0  Worry too much - different things  2 3 0  Trouble relaxing 1 1 0  Restless 1 0 0  Easily annoyed or irritable 1 1 0  Afraid - awful might happen 1 2 0  Total GAD 7 Score 9 12 0  Anxiety Difficulty Not difficult at all Not difficult at all -          Assessment & Plan:   Problem List Items Addressed This Visit      Other   Anxiety - Primary    Poorly controlled. Discussed likely some component of underlying chronic anxiety and may benefit from therapy (number provided) and will start Lexapro 5 mg and increase as tolerated to 10 mg. Discussed trial of ativan as xanax was making her sleepy. Discussed goal would be to need this less.       Relevant Medications   escitalopram (LEXAPRO) 5 MG tablet   LORazepam (ATIVAN) 0.5 MG tablet       Return in about 6 weeks (around 06/21/2020) for anxiety.  Lesleigh Noe, MD  This  visit occurred during the SARS-CoV-2 public health emergency.  Safety protocols were in place, including screening questions prior to the visit, additional usage of staff PPE, and extensive cleaning of exam room while observing appropriate contact time as indicated for disinfecting solutions.

## 2020-05-10 NOTE — Assessment & Plan Note (Signed)
Poorly controlled. Discussed likely some component of underlying chronic anxiety and may benefit from therapy (number provided) and will start Lexapro 5 mg and increase as tolerated to 10 mg. Discussed trial of ativan as xanax was making her sleepy. Discussed goal would be to need this less.

## 2020-05-12 ENCOUNTER — Encounter: Payer: Self-pay | Admitting: Family Medicine

## 2020-05-16 MED ORDER — FLUOXETINE HCL 10 MG PO TABS: 10.0000 mg | ORAL_TABLET | Freq: Every day | ORAL | 1 refills | Status: DC

## 2020-05-16 NOTE — Addendum Note (Signed)
Addended by: Waunita Schooner R on: 05/16/2020 01:02 PM   Modules accepted: Orders

## 2020-05-16 NOTE — Telephone Encounter (Signed)
Called patient and discussed plan  1) stop lexapro - worsening anxiety 2) Start Fluoxetine 10 mg tablet - 1/2 tablet to start 3) Take ativan prn daily if needed 4) Update next week

## 2020-05-19 DIAGNOSIS — F32A Depression, unspecified: Secondary | ICD-10-CM | POA: Diagnosis not present

## 2020-05-19 DIAGNOSIS — F419 Anxiety disorder, unspecified: Secondary | ICD-10-CM | POA: Diagnosis not present

## 2020-05-20 ENCOUNTER — Encounter: Payer: Self-pay | Admitting: Family Medicine

## 2020-05-23 DIAGNOSIS — F419 Anxiety disorder, unspecified: Secondary | ICD-10-CM | POA: Diagnosis not present

## 2020-05-23 DIAGNOSIS — F32A Depression, unspecified: Secondary | ICD-10-CM | POA: Diagnosis not present

## 2020-06-02 DIAGNOSIS — F32A Depression, unspecified: Secondary | ICD-10-CM | POA: Diagnosis not present

## 2020-06-02 DIAGNOSIS — F419 Anxiety disorder, unspecified: Secondary | ICD-10-CM | POA: Diagnosis not present

## 2020-06-08 ENCOUNTER — Other Ambulatory Visit: Payer: Self-pay | Admitting: Family Medicine

## 2020-06-08 DIAGNOSIS — F419 Anxiety disorder, unspecified: Secondary | ICD-10-CM

## 2020-06-08 MED ORDER — LORAZEPAM 0.5 MG PO TABS: 0.2500 mg | ORAL_TABLET | Freq: Two times a day (BID) | ORAL | 0 refills | Status: DC | PRN

## 2020-06-08 NOTE — Telephone Encounter (Signed)
Last filled: 05/10/20 #10  Spoke with pt and she says she likes to have these on hand and usually just takes 1/2 pill.   Next OV: 06/21/20

## 2020-06-08 NOTE — Telephone Encounter (Signed)
If continuing to need 10/month she will need to schedule a follow-up visit before next refill.

## 2020-06-09 DIAGNOSIS — F419 Anxiety disorder, unspecified: Secondary | ICD-10-CM | POA: Diagnosis not present

## 2020-06-09 DIAGNOSIS — F32A Depression, unspecified: Secondary | ICD-10-CM | POA: Diagnosis not present

## 2020-06-16 ENCOUNTER — Encounter: Payer: Self-pay | Admitting: Family Medicine

## 2020-06-16 DIAGNOSIS — F419 Anxiety disorder, unspecified: Secondary | ICD-10-CM | POA: Diagnosis not present

## 2020-06-16 DIAGNOSIS — F32A Depression, unspecified: Secondary | ICD-10-CM | POA: Diagnosis not present

## 2020-06-21 ENCOUNTER — Other Ambulatory Visit: Payer: Self-pay

## 2020-06-21 ENCOUNTER — Encounter: Payer: Self-pay | Admitting: Family Medicine

## 2020-06-21 ENCOUNTER — Ambulatory Visit (INDEPENDENT_AMBULATORY_CARE_PROVIDER_SITE_OTHER): Payer: Medicare Other | Admitting: Family Medicine

## 2020-06-21 VITALS — BP 118/80 | HR 69 | Temp 97.8°F | Ht 68.0 in | Wt 147.8 lb

## 2020-06-21 DIAGNOSIS — F419 Anxiety disorder, unspecified: Secondary | ICD-10-CM | POA: Diagnosis not present

## 2020-06-21 DIAGNOSIS — I10 Essential (primary) hypertension: Secondary | ICD-10-CM | POA: Diagnosis not present

## 2020-06-21 DIAGNOSIS — E038 Other specified hypothyroidism: Secondary | ICD-10-CM | POA: Diagnosis not present

## 2020-06-21 DIAGNOSIS — G4700 Insomnia, unspecified: Secondary | ICD-10-CM | POA: Diagnosis not present

## 2020-06-21 DIAGNOSIS — M85852 Other specified disorders of bone density and structure, left thigh: Secondary | ICD-10-CM | POA: Diagnosis not present

## 2020-06-21 DIAGNOSIS — K2970 Gastritis, unspecified, without bleeding: Secondary | ICD-10-CM | POA: Diagnosis not present

## 2020-06-21 DIAGNOSIS — R5383 Other fatigue: Secondary | ICD-10-CM | POA: Diagnosis not present

## 2020-06-21 DIAGNOSIS — K219 Gastro-esophageal reflux disease without esophagitis: Secondary | ICD-10-CM | POA: Diagnosis not present

## 2020-06-21 LAB — VITAMIN D 25 HYDROXY (VIT D DEFICIENCY, FRACTURES): VITD: 42.87 ng/mL (ref 30.00–100.00)

## 2020-06-21 LAB — CBC
HCT: 41.5 % (ref 36.0–46.0)
Hemoglobin: 14.6 g/dL (ref 12.0–15.0)
MCHC: 35 g/dL (ref 30.0–36.0)
MCV: 97.2 fl (ref 78.0–100.0)
Platelets: 235 10*3/uL (ref 150.0–400.0)
RBC: 4.27 Mil/uL (ref 3.87–5.11)
RDW: 13.8 % (ref 11.5–15.5)
WBC: 6.3 10*3/uL (ref 4.0–10.5)

## 2020-06-21 LAB — COMPREHENSIVE METABOLIC PANEL
ALT: 13 U/L (ref 0–35)
AST: 14 U/L (ref 0–37)
Albumin: 4.3 g/dL (ref 3.5–5.2)
Alkaline Phosphatase: 61 U/L (ref 39–117)
BUN: 11 mg/dL (ref 6–23)
CO2: 27 mEq/L (ref 19–32)
Calcium: 9.4 mg/dL (ref 8.4–10.5)
Chloride: 105 mEq/L (ref 96–112)
Creatinine, Ser: 0.94 mg/dL (ref 0.40–1.20)
GFR: 62.27 mL/min (ref >60.00)
Glucose, Bld: 101 mg/dL — ABNORMAL HIGH (ref 70–99)
Potassium: 4 mEq/L (ref 3.5–5.1)
Sodium: 141 mEq/L (ref 135–145)
Total Bilirubin: 0.8 mg/dL (ref 0.2–1.2)
Total Protein: 6.7 g/dL (ref 6.0–8.3)

## 2020-06-21 LAB — FERRITIN: Ferritin: 22.9 ng/mL (ref 10.0–291.0)

## 2020-06-21 LAB — TSH: TSH: 4.18 u[IU]/mL (ref 0.35–4.50)

## 2020-06-21 LAB — T4, FREE: Free T4: 0.81 ng/dL (ref 0.60–1.60)

## 2020-06-21 MED ORDER — LORAZEPAM 0.5 MG PO TABS: 0.2500 mg | ORAL_TABLET | Freq: Every day | ORAL | 0 refills | Status: DC | PRN

## 2020-06-21 NOTE — Assessment & Plan Note (Signed)
Responding well to melatonin and lower dose of trazodone. If persisting and trazodone over sedating discussed Doxepin as possible alternative.

## 2020-06-21 NOTE — Patient Instructions (Addendum)
#  anxiety - glad this is better - reach out if needed - or if needing ativan more than 3 days per week on a regular basis   #Sleep - continue trazodone and melatonin as needed - if not working well we could consider Doxepin in the future

## 2020-06-21 NOTE — Assessment & Plan Note (Signed)
Improved with therapy. Cont prn ativan 0.25 mg <3 times per week. Refill provided. If worsening would consider paxil as tolerated in the past

## 2020-06-21 NOTE — Assessment & Plan Note (Signed)
Well controlled but with fatigue, will check for iron deficiency

## 2020-06-21 NOTE — Assessment & Plan Note (Signed)
BP well controlled off medication. Assess liver/kidney function

## 2020-06-21 NOTE — Progress Notes (Signed)
Subjective:     Lisa Medina is a 70 y.o. female presenting for Follow-up (6 week )     HPI  #Anxiety - therapy has been going well - has been going weekly - working on [employer], [employer]  - building a [employer] - does still have some days of feeling on edge - has not taken the ativan daily - will take 1/2 tablet (0.25 mg) and this works well - the xanax made her more sleepy - overall feels things have improved - didn't like the side effects of the prozac/lexapro - took paxil in the past - Taking Ativan - 0.25 mg   #insomnia - tried a lower dose of trazodone can take 25 mg with success - less sleepy - does feel like she is sleepy the next  #Low energy - for about 3 weeks - sleep has been ok during this time - no changes in hair, skin, nail - no palpitations, blood in stool or urine  Review of Systems  05/10/2020: Clinic - Anxiety - lexapro 5 mg and ativan prn 05/12/2020: mychart - anxiety worse - stop lexapro, start fluoxetine 05/20/2020: mychart - starting therapy, stopped prozac. New issue of insomnia and trazodone making her sleepy  Social History   Tobacco Use  Smoking Status Former Smoker  . Years: 15.00  . Quit date: 01/16/1992  . Years since quitting: 28.4  Smokeless Tobacco Never Used        Objective:    BP Readings from Last 3 Encounters:  06/21/20 118/80  05/10/20 122/80  03/01/20 124/72   Wt Readings from Last 3 Encounters:  06/21/20 147 lb 12 oz (67 kg)  05/10/20 150 lb 12 oz (68.4 kg)  03/01/20 158 lb 8 oz (71.9 kg)    BP 118/80   Pulse 69   Temp 97.8 F (36.6 C) (Temporal)   Ht 5\' 8"  (1.727 m)   Wt 147 lb 12 oz (67 kg)   SpO2 97%   BMI 22.47 kg/m    Physical Exam Constitutional:      General: She is not in acute distress.    Appearance: She is well-developed. She is not diaphoretic.  HENT:     Right Ear: External ear normal.     Left Ear: External ear normal.     Nose: Nose normal.  Eyes:     Conjunctiva/sclera: Conjunctivae  normal.  Neck:     Comments: Normal thyroid Cardiovascular:     Rate and Rhythm: Normal rate and regular rhythm.     Heart sounds: No murmur heard.   Pulmonary:     Effort: Pulmonary effort is normal. No respiratory distress.     Breath sounds: Normal breath sounds. No wheezing.  Musculoskeletal:     Cervical back: Neck supple.  Skin:    General: Skin is warm and dry.     Capillary Refill: Capillary refill takes less than 2 seconds.  Neurological:     Mental Status: She is alert. Mental status is at baseline.  Psychiatric:        Mood and Affect: Mood normal.        Behavior: Behavior normal.           Assessment & Plan:   Problem List Items Addressed This Visit      Cardiovascular and Mediastinum   HTN (hypertension)    BP well controlled off medication. Assess liver/kidney function      Relevant Orders   Comprehensive metabolic panel     Digestive  GERD (gastroesophageal reflux disease)    Well controlled but with fatigue, will check for iron deficiency      Relevant Orders   Ferritin     Musculoskeletal and Integument   Osteopenia of neck of femur    Fatigue, will assess Vit D to see if this is low      Relevant Orders   Vitamin D, 25-hydroxy     Other   Anxiety    Improved with therapy. Cont prn ativan 0.25 mg <3 times per week. Refill provided. If worsening would consider paxil as tolerated in the past      Relevant Medications   ALPRAZolam (XANAX) 0.25 MG tablet   LORazepam (ATIVAN) 0.5 MG tablet   Insomnia    Responding well to melatonin and lower dose of trazodone. If persisting and trazodone over sedating discussed Doxepin as possible alternative.        Other Visit Diagnoses    Subclinical hypothyroidism    -  Primary   Relevant Orders   TSH   T3   T4, free   Other fatigue       Relevant Orders   CBC   Ferritin   Vitamin D, 25-hydroxy   Gastritis without bleeding, unspecified chronicity, unspecified gastritis type         Relevant Orders   Ferritin       Return if symptoms worsen or fail to improve.  Lesleigh Noe, MD  This visit occurred during the SARS-CoV-2 public health emergency.  Safety protocols were in place, including screening questions prior to the visit, additional usage of staff PPE, and extensive cleaning of exam room while observing appropriate contact time as indicated for disinfecting solutions.

## 2020-06-21 NOTE — Assessment & Plan Note (Signed)
Fatigue, will assess Vit D to see if this is low

## 2020-06-24 ENCOUNTER — Encounter: Payer: Self-pay | Admitting: Family Medicine

## 2020-06-24 DIAGNOSIS — R110 Nausea: Secondary | ICD-10-CM

## 2020-06-24 DIAGNOSIS — U071 COVID-19: Secondary | ICD-10-CM

## 2020-06-25 DIAGNOSIS — Z20822 Contact with and (suspected) exposure to covid-19: Secondary | ICD-10-CM | POA: Diagnosis not present

## 2020-06-27 MED ORDER — ONDANSETRON HCL 4 MG PO TABS: 4.0000 mg | ORAL_TABLET | Freq: Three times a day (TID) | ORAL | 0 refills | Status: DC | PRN

## 2020-06-28 MED ORDER — NIRMATRELVIR/RITONAVIR (PAXLOVID)TABLET: 3.0000 | ORAL_TABLET | Freq: Two times a day (BID) | ORAL | 0 refills | Status: AC

## 2020-06-28 NOTE — Addendum Note (Signed)
Addended by: Lesleigh Noe on: 06/28/2020 07:51 AM   Modules accepted: Orders

## 2020-07-09 DIAGNOSIS — Z20822 Contact with and (suspected) exposure to covid-19: Secondary | ICD-10-CM | POA: Diagnosis not present

## 2020-09-01 ENCOUNTER — Other Ambulatory Visit: Payer: Self-pay | Admitting: Family Medicine

## 2020-09-01 DIAGNOSIS — Z1231 Encounter for screening mammogram for malignant neoplasm of breast: Secondary | ICD-10-CM

## 2020-09-22 ENCOUNTER — Ambulatory Visit
Admission: RE | Admit: 2020-09-22 | Discharge: 2020-09-22 | Disposition: A | Discharge status: Home / Self Care | Payer: Medicare Other | Source: Ambulatory Visit | Attending: Family Medicine | Admitting: Family Medicine

## 2020-09-22 ENCOUNTER — Other Ambulatory Visit: Payer: Self-pay

## 2020-09-22 DIAGNOSIS — Z1231 Encounter for screening mammogram for malignant neoplasm of breast: Secondary | ICD-10-CM | POA: Diagnosis not present

## 2020-11-10 ENCOUNTER — Ambulatory Visit (INDEPENDENT_AMBULATORY_CARE_PROVIDER_SITE_OTHER): Payer: Medicare Other | Admitting: Family Medicine

## 2020-11-10 ENCOUNTER — Other Ambulatory Visit: Payer: Self-pay

## 2020-11-10 ENCOUNTER — Encounter: Payer: Self-pay | Admitting: Family Medicine

## 2020-11-10 VITALS — BP 130/90 | HR 62 | Temp 96.4°F | Ht 67.75 in | Wt 155.1 lb

## 2020-11-10 DIAGNOSIS — M85859 Other specified disorders of bone density and structure, unspecified thigh: Secondary | ICD-10-CM

## 2020-11-10 DIAGNOSIS — F419 Anxiety disorder, unspecified: Secondary | ICD-10-CM

## 2020-11-10 DIAGNOSIS — I10 Essential (primary) hypertension: Secondary | ICD-10-CM

## 2020-11-10 DIAGNOSIS — E2839 Other primary ovarian failure: Secondary | ICD-10-CM | POA: Diagnosis not present

## 2020-11-10 DIAGNOSIS — Z0000 Encounter for general adult medical examination without abnormal findings: Secondary | ICD-10-CM | POA: Diagnosis not present

## 2020-11-10 NOTE — Assessment & Plan Note (Signed)
Working with therapy. Did not take prozac. Has been doing well. Still prn ativan

## 2020-11-10 NOTE — Progress Notes (Signed)
Subjective:   Lisa Medina is a 70 y.o. female who presents for Medicare Annual (Subsequent) preventive examination.  Review of Systems    Review of Systems  Constitutional:  Negative for chills and fever.  HENT:  Negative for congestion and sore throat.   Eyes:  Negative for blurred vision and double vision.  Respiratory:  Negative for shortness of breath.   Cardiovascular:  Negative for chest pain.  Gastrointestinal:  Negative for heartburn, nausea and vomiting.  Genitourinary: Negative.   Musculoskeletal: Negative.  Negative for myalgias.  Skin:  Negative for rash.  Neurological:  Negative for dizziness and headaches.  Endo/Heme/Allergies:  Does not bruise/bleed easily.  Psychiatric/Behavioral:  Negative for depression. The patient is not nervous/anxious.    Cardiac Risk Factors include: advanced age (>55men, >38 women);dyslipidemia;smoking/ tobacco exposure;hypertension     Objective:    Today's Vitals   11/10/20 0959  BP: 130/90  Pulse: 62  Temp: (!) 96.4 F (35.8 C)  TempSrc: Temporal  SpO2: 97%  Weight: 155 lb 2 oz (70.4 kg)  Height: 5' 7.75" (1.721 m)   Body mass index is 23.76 kg/m.  Advanced Directives 11/10/2020 11/23/2019 09/10/2018  Does Patient Have a Medical Advance Directive? No No No  Would patient like information on creating a medical advance directive? Yes (MAU/Ambulatory/Procedural Areas - Information given) Yes (MAU/Ambulatory/Procedural Areas - Information given) Yes (MAU/Ambulatory/Procedural Areas - Information given)    Current Medications (verified) Outpatient Encounter Medications as of 11/10/2020  Medication Sig   Ca Phosphate-Cholecalciferol (CALCIUM 500 + D3) 250-500 MG-UNIT CHEW Chew by mouth daily.   esomeprazole (NEXIUM) 20 MG capsule Take 20 mg by mouth daily at 12 noon.   IRON, FERROUS SULFATE, PO Take 45 mg by mouth daily.   LORazepam (ATIVAN) 0.5 MG tablet Take 0.5-1 tablets (0.25-0.5 mg total) by mouth daily as needed for  anxiety.   ondansetron (ZOFRAN) 4 MG tablet Take 1 tablet (4 mg total) by mouth every 8 (eight) hours as needed for nausea or vomiting.   traZODone (DESYREL) 50 MG tablet Take 0.5-1 tablets (25-50 mg total) by mouth at bedtime as needed for sleep.   [DISCONTINUED] ALPRAZolam (XANAX) 0.25 MG tablet Take 0.25 mg by mouth at bedtime as needed for anxiety.   No facility-administered encounter medications on file as of 11/10/2020.    Allergies (verified) Macrobid [nitrofurantoin]   History: Past Medical History:  Diagnosis Date   GERD (gastroesophageal reflux disease)    Hypertension    Urinary tract bacterial infections    Past Surgical History:  Procedure Laterality Date   COLONOSCOPY  2003, 2008   UPPER GI ENDOSCOPY     Family History  Problem Relation Age of Onset   Hyperlipidemia Mother    Stroke Mother 46   Hypertension Mother    Heart disease Father    Hypertension Father    Hypertension Brother    Hypertension Maternal Grandmother    Hypertension Maternal Grandfather    Hypertension Paternal Grandmother    Hypertension Paternal Grandfather    Colon cancer Neg Hx    Esophageal cancer Neg Hx    Rectal cancer Neg Hx    Stomach cancer Neg Hx    Breast cancer Neg Hx    Social History   Socioeconomic History   Marital status: Married    Spouse name: Lisa Medina   Number of children: 0   Years of education: some college   Highest education level: Not on file  Occupational History   Not on  file  Tobacco Use   Smoking status: Former    Years: 15.00    Types: Cigarettes    Quit date: 01/16/1992    Years since quitting: 28.8   Smokeless tobacco: Never  Vaping Use   Vaping Use: Never used  Substance and Sexual Activity   Alcohol use: Yes    Alcohol/week: 2.0 standard drinks    Types: 2 Glasses of wine per week    Comment: 4 times a week, 1 glass   Drug use: No   Sexual activity: Yes    Birth control/protection: Post-menopausal  Other Topics Concern   Not  on file  Social History Narrative   Full code- desires CPR but does not want prolonged life support if futile.   09/14/19   From: [place]    Living: with husband, Lisa Medina (1997, but together since 1985)   Work: retired, works part-time - Press photographer and use taxes      Family: brother in [place] and brother in [place]      Enjoys: exercise      Exercise: working out 3 times a week- [employer] and swimming   Diet: fair - breakfast, lunch, and light dinner      Safety   Seat belts: Yes    Guns: No   Safe in relationships: Yes    Social Determinants of Radio broadcast assistant Strain: Not on Art therapist Insecurity: Not on file  Transportation Needs: Not on file  Physical Activity: Not on file  Stress: Not on file  Social Connections: Not on file    Tobacco Counseling Counseling given: Not Answered   Clinical Intake:  Pre-visit preparation completed: No  Pain : No/denies pain     BMI - recorded: 23.76 Nutritional Status: BMI of 19-24  Normal Nutritional Risks: None Diabetes: No  How often do you need to have someone help you when you read instructions, pamphlets, or other written materials from your doctor or pharmacy?: 1 - Never What is the last grade level you completed in school?: some college  Diabetic? no  Interpreter Needed?: No      Activities of Daily Living In your present state of health, do you have any difficulty performing the following activities: 11/10/2020 11/23/2019  Hearing? N N  Vision? N N  Difficulty concentrating or making decisions? Lisa Medina  Comment is starting to notice some memory issues remembering  Walking or climbing stairs? N N  Dressing or bathing? N N  Doing errands, shopping? N N  Preparing Food and eating ? N N  Using the Toilet? N N  In the past six months, have you accidently leaked urine? N N  Do you have problems with loss of bowel control? N N  Managing your Medications? N N  Managing your Finances? N N  Housekeeping  or managing your Housekeeping? N N  Some recent data might be hidden    Patient Care Team: Lisa Noe, MD as PCP - General (Family Medicine) Rolm Bookbinder, MD as Consulting Physician (Dermatology)  Indicate any recent Medical Services you may have received from other than [facility] providers in the past year (date may be approximate).     Assessment:   This is a routine wellness examination for Ersilia.  Hearing/Vision screen Hearing Screening   250Hz  500Hz  1000Hz  2000Hz  4000Hz   Right ear 20 20 20 20 20   Left ear 20 20 20 20 20    Vision Screening   Right eye Left eye Both eyes  Without  correction     With correction 20/30 20/25 20/25     Dietary issues and exercise activities discussed: Current Exercise Habits: Home exercise routine, Type of exercise: strength training/weights (swimming), Time (Minutes): 60, Frequency (Times/Week): 5, Weekly Exercise (Minutes/Week): 300, Intensity: Moderate, Exercise limited by: None identified   Goals Addressed             This Visit's Progress    Patient Stated       Maintain current weight and exercise      Depression Screen PHQ 2/9 Scores 11/10/2020 06/21/2020 03/01/2020 11/23/2019 09/14/2019 09/10/2018 01/24/2017  PHQ - 2 Score 0 1 1 1  0 0 0  PHQ- 9 Score - 4 5 3  0 - -    Fall Risk Fall Risk  11/10/2020 11/23/2019 09/14/2019 09/10/2018 01/24/2017  Falls in the past year? 0 0 0 0 No  Number falls in past yr: 0 0 0 - -  Injury with Fall? - - 0 - -  Follow up - - Falls evaluation completed - -    FALL RISK PREVENTION PERTAINING TO THE HOME:  Any stairs in or around the home? No  If so, are there any without handrails?  N/a Home free of loose throw rugs in walkways, pet beds, electrical cords, etc? Yes  Adequate lighting in your home to reduce risk of falls? Yes   ASSISTIVE DEVICES UTILIZED TO PREVENT FALLS:  Life alert? No  Use of a cane, walker or w/c? No  Grab bars in the bathroom? Yes  Shower chair or bench in shower? Yes   Elevated toilet seat or a handicapped toilet? Yes    Cognitive Function:      Mini-Cog - 11/10/20 1022     Normal clock drawing test? yes    How many words correct? 3                Immunizations Immunization History  Administered Date(s) Administered   Fluad Quad(high Dose 65+) 10/24/2020   Influenza,inj,Quad PF,6+ Mos 01/24/2017, 09/10/2018   Influenza-Unspecified 10/15/2013, 10/07/2019   PFIZER(Purple Top)SARS-COV-2 Vaccination 02/25/2019, 03/18/2019, 11/02/2019   Pneumococcal Conjugate-13 09/03/2017   Pneumococcal Polysaccharide-23 09/10/2018   Tdap 09/10/2018   Zoster Recombinat (Shingrix) 01/06/2020, 03/10/2020   Zoster, Live 06/16/2014    TDAP status: Up to date  Flu Vaccine status: Up to date  Pneumococcal vaccine status: Up to date  Covid-19 vaccine status: Completed vaccines  Qualifies for Shingles Vaccine? Yes   Zostavax completed Yes   Shingrix Completed?: Yes  Screening Tests Health Maintenance  Topic Date Due   COVID-19 Vaccine (4 - Booster for Pfizer series) 12/28/2019   MAMMOGRAM  09/23/2022   COLONOSCOPY (Pts 45-35yrs Insurance coverage will need to be confirmed)  11/08/2022   TETANUS/TDAP  09/09/2028   Pneumonia Vaccine 68+ Years old  Completed   INFLUENZA VACCINE  Completed   DEXA SCAN  Completed   Hepatitis C Screening  Completed   Zoster Vaccines- Shingrix  Completed   HPV VACCINES  Aged Out    Health Maintenance  Health Maintenance Due  Topic Date Due   COVID-19 Vaccine (4 - Booster for [place] series) 12/28/2019    Colorectal cancer screening: Type of screening: Colonoscopy. Completed 2014. Repeat every 10 years  Mammogram status: Completed 09/2020. Repeat every year  Bone Density status: Completed 10/2017. Results reflect: Bone density results: OSTEOPENIA. Repeat every 3-5 years.  Social History   Tobacco Use  Smoking Status Former   Years: 15.00   Types: Cigarettes   Quit  date: 01/16/1992   Years since quitting:  28.8  Smokeless Tobacco Never     Lung Cancer Screening: (Low Dose CT Chest recommended if Age 22-80 years, 30 pack-year currently smoking OR have quit w/in 15years.) does not qualify.   Lung Cancer Screening Referral: n/a  Additional Screening:   Lab Results  Component Value Date   HEPCAB NON-REACTIVE 09/03/2017    Hepatitis C Screening: does qualify; Completed see above  Vision Screening: Recommended annual ophthalmology exams for early detection of glaucoma and other disorders of the eye. Is the patient up to date with their annual eye exam?  Yes    Dental Screening: Recommended annual dental exams for proper oral hygiene  Community Resource Referral / Chronic Care Management: CRR required this visit?  No   CCM required this visit?  No   The 10-year ASCVD risk score (Arnett DK, et al., 2019) is: 8.2%   Values used to calculate the score:     Age: 39 years     Sex: Female     Is Non-Hispanic African American: No     Diabetic: No     Tobacco smoker: No     Systolic Blood Pressure: 643 mmHg     Is BP treated: No     HDL Cholesterol: 68.5 mg/dL     Total Cholesterol: 192 mg/dL     Plan:     Problem List Items Addressed This Visit       Cardiovascular and Mediastinum   HTN (hypertension)     Musculoskeletal and Integument   Osteopenia of neck of femur   Relevant Orders   DG Bone Density     Other   Anxiety    Working with therapy. Did not take prozac. Has been doing well. Still prn ativan      Other Visit Diagnoses     Encounter for Medicare annual wellness exam    -  Primary   Estrogen deficiency       Relevant Orders   DG Bone Density        I have personally reviewed and noted the following in the patient's chart:   Medical and social history Use of alcohol, tobacco or illicit drugs  Current medications and supplements including opioid prescriptions.  Functional ability and status Nutritional status Physical activity Advanced  directives List of other physicians Hospitalizations, surgeries, and ER visits in previous 12 months Vitals Screenings to include cognitive, depression, and falls Referrals and appointments  In addition, I have reviewed and discussed with patient certain preventive protocols, quality metrics, and best practice recommendations. A written personalized care plan for preventive services as well as general preventive health recommendations were provided to patient.     Lisa Noe, MD   11/10/2020

## 2020-11-29 ENCOUNTER — Ambulatory Visit: Payer: Medicare Other | Attending: Internal Medicine

## 2020-11-29 ENCOUNTER — Other Ambulatory Visit: Payer: Self-pay

## 2020-11-29 DIAGNOSIS — Z23 Encounter for immunization: Secondary | ICD-10-CM

## 2020-11-29 MED ORDER — PFIZER COVID-19 VAC BIVALENT 30 MCG/0.3ML IM SUSP
INTRAMUSCULAR | 0 refills | Status: DC
  Filled 2020-11-29: qty 0.3, 1d supply, fill #0

## 2020-11-29 NOTE — Progress Notes (Signed)
Covid-19 Vaccination Clinic  Name:  LILLIEANNA HAVLIK    MRN: [id number] DOB: 12-18-1950  11/29/2020  Ms. Helmig was observed post Covid-19 immunization for 15 minutes without incident. She was provided with Vaccine Information Sheet and instruction to access the V-Safe system.   Ms. Agyemang was instructed to call 911 with any severe reactions post vaccine: Difficulty breathing  Swelling of face and throat  A fast heartbeat  A bad rash all over body  Dizziness and weakness   Immunizations Administered     Name Date Dose VIS Date Route   Pfizer Covid-19 Vaccine Bivalent Booster 11/29/2020  9:07 AM 0.3 mL 09/14/2020 Intramuscular   Manufacturer: [employer], [employer]   Lot: [id number]   NDC: [address]-2      Drusilla Kanner, PharmD, MBA Clinical Acute Care Pharmacist

## 2020-12-19 DIAGNOSIS — H04123 Dry eye syndrome of bilateral lacrimal glands: Secondary | ICD-10-CM | POA: Diagnosis not present

## 2020-12-19 DIAGNOSIS — H524 Presbyopia: Secondary | ICD-10-CM | POA: Diagnosis not present

## 2020-12-19 DIAGNOSIS — H52223 Regular astigmatism, bilateral: Secondary | ICD-10-CM | POA: Diagnosis not present

## 2020-12-19 DIAGNOSIS — H5213 Myopia, bilateral: Secondary | ICD-10-CM | POA: Diagnosis not present

## 2021-02-22 ENCOUNTER — Other Ambulatory Visit: Payer: Self-pay | Admitting: Family Medicine

## 2021-02-22 DIAGNOSIS — Z1231 Encounter for screening mammogram for malignant neoplasm of breast: Secondary | ICD-10-CM

## 2021-03-09 ENCOUNTER — Encounter: Payer: Self-pay | Admitting: Family Medicine

## 2021-05-11 ENCOUNTER — Encounter: Payer: Self-pay | Admitting: Family Medicine

## 2021-05-11 NOTE — Telephone Encounter (Signed)
Pt scheduled appt with Dr. Glori Bickers since Dr. Einar Pheasant is off on Friday, please see her mychart message  ?

## 2021-05-12 ENCOUNTER — Encounter: Payer: Self-pay | Admitting: Family Medicine

## 2021-05-12 ENCOUNTER — Ambulatory Visit (INDEPENDENT_AMBULATORY_CARE_PROVIDER_SITE_OTHER): Payer: Medicare Other | Admitting: Family Medicine

## 2021-05-12 VITALS — BP 138/70 | HR 73 | Temp 97.8°F | Resp 18 | Ht 67.75 in | Wt 164.4 lb

## 2021-05-12 DIAGNOSIS — N952 Postmenopausal atrophic vaginitis: Secondary | ICD-10-CM

## 2021-05-12 DIAGNOSIS — N949 Unspecified condition associated with female genital organs and menstrual cycle: Secondary | ICD-10-CM

## 2021-05-12 MED ORDER — ESTRADIOL 0.1 MG/GM VA CREA: TOPICAL_CREAM | VAGINAL | 0 refills | Status: DC

## 2021-05-12 NOTE — Progress Notes (Signed)
? ?Subjective:  ? ? Patient ID: Lisa Medina, female    DOB: April 08, 1950, 71 y.o.   MRN: [id number] ? ?HPI ?71 yo pt of Dr Selena Batten presents with vaginal symptoms  ? ?Wt Readings from Last 3 Encounters:  ?05/12/21 164 lb 6.4 oz (74.6 kg)  ?11/10/20 155 lb 2 oz (70.4 kg)  ?06/21/20 147 lb 12 oz (67 kg)  ? ?25.18 kg/m? ? ?  ?Vaginal problems , rash? ?Outside  ?Mostly on the left  ?Very irritated  ? ?Hurts and burns and itches  ?Wiping is painful at times  ?Really sensitive  ? ? ?She had used some wet wipes recently ? ?No vag discharge  ?No internal symptoms  ?No vaginal bleeding ?No pelvic and abd pain  ? ?No intercourse in 2 y , no STDs  ? ?Has used vagasil with benzocaine  ? ?She swims 2-3 times per week  ? ?No abx or steroids lately ?No urine leakage  ? ?Has h/o post coital uti and vaginal atrophy ?She tried vaginal estrogen once but it was very expensive  ? ?No h/o gyn surgeries  ? ?Results for orders placed or performed in visit on 05/12/21  ?POCT Wet Prep [facility])  ?Result Value Ref Range  ? Source Wet Prep POC vaginal   ? WBC, Wet Prep HPF POC none   ? Bacteria Wet Prep HPF POC Few Few  ? BACTERIA WET PREP MORPHOLOGY POC    ? Clue Cells Wet Prep HPF POC None None  ? Clue Cells Wet Prep Whiff POC    ? Yeast Wet Prep HPF POC None None  ? KOH Wet Prep POC None None  ? Trichomonas Wet Prep HPF POC Absent Absent  ? ? ?Patient Active Problem List  ? Diagnosis Date Noted  ? Vaginal discomfort 05/14/2021  ? Insomnia 03/01/2020  ? Vaginal atrophy 09/14/2019  ? Postcoital UTI 09/14/2019  ? Osteopenia of neck of femur 09/09/2018  ? Anxiety 08/23/2016  ? HTN (hypertension) 11/18/2013  ? GERD (gastroesophageal reflux disease) 11/18/2013  ? Post-menopausal 11/18/2013  ? ?Past Medical History:  ?Diagnosis Date  ? GERD (gastroesophageal reflux disease)   ? Hypertension   ? Urinary tract bacterial infections   ? ?Past Surgical History:  ?Procedure Laterality Date  ? COLONOSCOPY  2003, 2008  ? UPPER GI ENDOSCOPY    ? ?Social  History  ? ?Tobacco Use  ? Smoking status: Former  ?  Years: 15.00  ?  Types: Cigarettes  ?  Quit date: 01/16/1992  ?  Years since quitting: 29.3  ? Smokeless tobacco: Never  ?Vaping Use  ? Vaping Use: Never used  ?Substance Use Topics  ? Alcohol use: Yes  ?  Alcohol/week: 2.0 standard drinks  ?  Types: 2 Glasses of wine per week  ?  Comment: 4 times a week, 1 glass  ? Drug use: No  ? ?Family History  ?Problem Relation Age of Onset  ? Hyperlipidemia Mother   ? Stroke Mother 13  ? Hypertension Mother   ? Heart disease Father   ? Hypertension Father   ? Hypertension Brother   ? Hypertension Maternal Grandmother   ? Hypertension Maternal Grandfather   ? Hypertension Paternal Grandmother   ? Hypertension Paternal Grandfather   ? Colon cancer Neg Hx   ? Esophageal cancer Neg Hx   ? Rectal cancer Neg Hx   ? Stomach cancer Neg Hx   ? Breast cancer Neg Hx   ? ?Allergies  ?Allergen Reactions  ?  Macrobid [Nitrofurantoin] Swelling  ? ?Current Outpatient Medications on File Prior to Visit  ?Medication Sig Dispense Refill  ? Ca Phosphate-Cholecalciferol (CALCIUM 500 + D3) 250-500 MG-UNIT CHEW Chew by mouth daily.    ? COVID-19 mRNA bivalent vaccine, Pfizer, (PFIZER COVID-19 VAC BIVALENT) injection Inject into the muscle. 0.3 mL 0  ? esomeprazole (NEXIUM) 20 MG capsule Take 20 mg by mouth daily at 12 noon.    ? IRON, FERROUS SULFATE, PO Take 45 mg by mouth daily.    ? LORazepam (ATIVAN) 0.5 MG tablet Take 0.5-1 tablets (0.25-0.5 mg total) by mouth daily as needed for anxiety. 30 tablet 0  ? ondansetron (ZOFRAN) 4 MG tablet Take 1 tablet (4 mg total) by mouth every 8 (eight) hours as needed for nausea or vomiting. 10 tablet 0  ? traZODone (DESYREL) 50 MG tablet Take 0.5-1 tablets (25-50 mg total) by mouth at bedtime as needed for sleep. 30 tablet 3  ? ?No current facility-administered medications on file prior to visit.  ?  ?Review of Systems  ?Gastrointestinal:  Negative for abdominal distention and abdominal pain.  ?Genitourinary:   Positive for vaginal pain. Negative for difficulty urinating, dysuria, frequency, genital sores, menstrual problem, pelvic pain, vaginal bleeding and vaginal discharge.  ? ?   ?Objective:  ? Physical Exam ?Exam conducted with a chaperone present.  ?Constitutional:   ?   General: She is not in acute distress. ?   Appearance: Normal appearance. She is normal weight. She is not ill-appearing.  ?Eyes:  ?   General:     ?   Right eye: No discharge.     ?   Left eye: No discharge.  ?   Conjunctiva/sclera: Conjunctivae normal.  ?   Pupils: Pupils are equal, round, and reactive to light.  ?Cardiovascular:  ?   Rate and Rhythm: Normal rate and regular rhythm.  ?Abdominal:  ?   General: Abdomen is flat. Bowel sounds are normal.  ?   Palpations: Abdomen is soft.  ?   Tenderness: There is no abdominal tenderness.  ?   Comments: No suprapubic tenderness or fullness    ?Genitourinary: ?   Labia:     ?   Right: No lesion or injury.     ?   Left: No lesion or injury.   ?   Urethra: No prolapse or urethral swelling.  ?   Comments: Hyperemia of labia minora ?No M or skin or mucosal breakdown  ?No obvious vaginal discharge ?Non tender  ? ? ?Musculoskeletal:  ?   Cervical back: Normal range of motion and neck supple.  ?Lymphadenopathy:  ?   Cervical: No cervical adenopathy.  ?Skin: ?   General: Skin is warm and dry.  ?   Findings: No rash.  ?Neurological:  ?   Mental Status: She is alert.  ?Psychiatric:     ?   Mood and Affect: Mood normal.  ? ? ? ? ? ?   ?Assessment & Plan:  ? ?Problem List Items Addressed This Visit   ? ?  ? Genitourinary  ? Vaginal atrophy - Primary  ?  Suspect this is the cause of vaginal symptoms  ?Negative wet prep  ?No signs of lichen sclerosis on exam and no erosions ? ?tx with estrace vaginal cream twice weekly  ?May help prevent utis as well  ?Update if not starting to improve in a week or if worsening  ?Plan f/u with pcp for a re check ? ?  ?  ?  ?  Other  ? Vaginal discomfort  ? Relevant Orders  ? POCT Wet  Prep [facility]) (Completed)  ? ? ?

## 2021-05-12 NOTE — Patient Instructions (Addendum)
Stop wet wipes  ?Dry gently as needed -pat with paper or soft towel  ? ?Lisa Medina is ok to use if helpful  ? ?Start estrace cream twice weekly  ?Use a pea sized amount internally and a little bit externally  ? ?If symptoms worsen in the meantime let us know  ? ?Follow up with Dr Einar Pheasant in 2 weeks  ? ? ? ? ? ? ? ?

## 2021-05-14 DIAGNOSIS — N949 Unspecified condition associated with female genital organs and menstrual cycle: Secondary | ICD-10-CM | POA: Insufficient documentation

## 2021-05-14 LAB — POCT WET PREP (WET MOUNT): Trichomonas Wet Prep HPF POC: ABSENT

## 2021-05-14 NOTE — Assessment & Plan Note (Signed)
Suspect this is the cause of vaginal symptoms  ?Negative wet prep  ?No signs of lichen sclerosis on exam and no erosions ? ?tx with estrace vaginal cream twice weekly  ?May help prevent utis as well  ?Update if not starting to improve in a week or if worsening  ?Plan f/u with pcp for a re check ?

## 2021-05-16 ENCOUNTER — Ambulatory Visit (INDEPENDENT_AMBULATORY_CARE_PROVIDER_SITE_OTHER): Payer: Medicare Other | Admitting: Family

## 2021-05-16 ENCOUNTER — Encounter: Payer: Self-pay | Admitting: Family

## 2021-05-16 VITALS — BP 130/76 | HR 65 | Temp 98.5°F | Resp 16 | Ht 67.75 in | Wt 164.3 lb

## 2021-05-16 DIAGNOSIS — R350 Frequency of micturition: Secondary | ICD-10-CM | POA: Diagnosis not present

## 2021-05-16 DIAGNOSIS — R319 Hematuria, unspecified: Secondary | ICD-10-CM

## 2021-05-16 DIAGNOSIS — N3001 Acute cystitis with hematuria: Secondary | ICD-10-CM | POA: Diagnosis not present

## 2021-05-16 DIAGNOSIS — N3000 Acute cystitis without hematuria: Secondary | ICD-10-CM | POA: Insufficient documentation

## 2021-05-16 LAB — POC URINALSYSI DIPSTICK (AUTOMATED)
Bilirubin, UA: NEGATIVE
Glucose, UA: NEGATIVE
Ketones, UA: NEGATIVE
Protein, UA: NEGATIVE
Spec Grav, UA: 1.01 (ref 1.010–1.025)
Urobilinogen, UA: NEGATIVE E.U./dL — AB
pH, UA: 7 (ref 5.0–8.0)

## 2021-05-16 LAB — URINALYSIS, ROUTINE W REFLEX MICROSCOPIC
Bilirubin Urine: NEGATIVE
Ketones, ur: NEGATIVE
Nitrite: POSITIVE — AB
Specific Gravity, Urine: 1.01 (ref 1.000–1.030)
Total Protein, Urine: NEGATIVE
Urine Glucose: NEGATIVE
Urobilinogen, UA: 0.2 (ref 0.0–1.0)
pH: 7 (ref 5.0–8.0)

## 2021-05-16 MED ORDER — SULFAMETHOXAZOLE-TRIMETHOPRIM 800-160 MG PO TABS: 1.0000 | ORAL_TABLET | Freq: Two times a day (BID) | ORAL | 0 refills | Status: AC

## 2021-05-16 NOTE — Assessment & Plan Note (Signed)
antbx sent to pharmacy, pt to take as directed. Encouraged increased water intake throughout the day. Urine culture/reflex pending results. Choosing to treat due to being symptomatic. If no improvement in the next 2 days pt advised to let me know.  

## 2021-05-16 NOTE — Assessment & Plan Note (Signed)
poct urine dipstick  ?Urinalysis as well as urine culture ordered pending results.  ?

## 2021-05-16 NOTE — Progress Notes (Signed)
? ?Established Patient Office Visit ? ?Subjective:  ?Patient ID: Lisa Medina, female    DOB: 10/10/1950  Age: 71 y.o. MRN: [id number] ? ?CC:  ?Chief Complaint  ?Patient presents with  ? Urinary Frequency  ?  X 3 days  ? ? ?HPI ?Lisa Medina is here today with concerns.  ? ?With urinary frequency for the last three days. Urinary odor.  ? ?Also, came in for office visit 4/28 and started estrace for vaginal atrophy. She is instead using vagisil which she is worried which may have contributed to a possible UTI.  ? ?Past Medical History:  ?Diagnosis Date  ? GERD (gastroesophageal reflux disease)   ? Hypertension   ? Urinary tract bacterial infections   ? ? ?Past Surgical History:  ?Procedure Laterality Date  ? COLONOSCOPY  2003, 2008  ? UPPER GI ENDOSCOPY    ? ? ?Family History  ?Problem Relation Age of Onset  ? Hyperlipidemia Mother   ? Stroke Mother 104  ? Hypertension Mother   ? Heart disease Father   ? Hypertension Father   ? Hypertension Brother   ? Hypertension Maternal Grandmother   ? Hypertension Maternal Grandfather   ? Hypertension Paternal Grandmother   ? Hypertension Paternal Grandfather   ? Colon cancer Neg Hx   ? Esophageal cancer Neg Hx   ? Rectal cancer Neg Hx   ? Stomach cancer Neg Hx   ? Breast cancer Neg Hx   ? ? ?Social History  ? ?Socioeconomic History  ? Marital status: Married  ?  Spouse name: Harless Litten  ? Number of children: 0  ? Years of education: some college  ? Highest education level: Not on file  ?Occupational History  ? Not on file  ?Tobacco Use  ? Smoking status: Former  ?  Years: 15.00  ?  Types: Cigarettes  ?  Quit date: 01/16/1992  ?  Years since quitting: 29.3  ? Smokeless tobacco: Never  ?Vaping Use  ? Vaping Use: Never used  ?Substance and Sexual Activity  ? Alcohol use: Yes  ?  Alcohol/week: 2.0 standard drinks  ?  Types: 2 Glasses of wine per week  ?  Comment: 4 times a week, 1 glass  ? Drug use: No  ? Sexual activity: Yes  ?  Birth control/protection: Post-menopausal   ?Other Topics Concern  ? Not on file  ?Social History Narrative  ? Full code- desires CPR but does not want prolonged life support if futile.  ? 09/14/19  ? From:    ? Living: with husband, Maurine Minister (1997, but together since 1985)  ? Work: retired, works part-time - Airline pilot and use taxes  ?   ? Family: brother in [place] and brother in [place]  ?   ? Enjoys: exercise  ?   ? Exercise: working out 3 times a week- weights and swimming  ? Diet: fair - breakfast, lunch, and light dinner  ?   ? Safety  ? Seat belts: Yes   ? Guns: No  ? Safe in relationships: Yes   ? ?Social Determinants of Health  ? ?Financial Resource Strain: Not on file  ?Food Insecurity: Not on file  ?Transportation Needs: Not on file  ?Physical Activity: Not on file  ?Stress: Not on file  ?Social Connections: Not on file  ?Intimate Partner Violence: Not on file  ? ? ?Outpatient Medications Prior to Visit  ?Medication Sig Dispense Refill  ? Ca Phosphate-Cholecalciferol (CALCIUM 500 + D3) 250-500 MG-UNIT CHEW Chew  by mouth daily.    ? COVID-19 mRNA bivalent vaccine, Pfizer, (PFIZER COVID-19 VAC BIVALENT) injection Inject into the muscle. 0.3 mL 0  ? esomeprazole (NEXIUM) 20 MG capsule Take 20 mg by mouth daily at 12 noon.    ? estradiol (ESTRACE VAGINAL) 0.1 MG/GM vaginal cream Use a pea sized amount in vagina and on vulva twice weekly 42.5 g 0  ? IRON, FERROUS SULFATE, PO Take 45 mg by mouth daily.    ? LORazepam (ATIVAN) 0.5 MG tablet Take 0.5-1 tablets (0.25-0.5 mg total) by mouth daily as needed for anxiety. 30 tablet 0  ? ondansetron (ZOFRAN) 4 MG tablet Take 1 tablet (4 mg total) by mouth every 8 (eight) hours as needed for nausea or vomiting. 10 tablet 0  ? traZODone (DESYREL) 50 MG tablet Take 0.5-1 tablets (25-50 mg total) by mouth at bedtime as needed for sleep. 30 tablet 3  ? ?No facility-administered medications prior to visit.  ? ? ?Allergies  ?Allergen Reactions  ? Macrobid [Nitrofurantoin] Swelling  ? ? ?ROS ?Review of Systems   ?Constitutional:  Negative for chills and fever.  ?Gastrointestinal:  Negative for abdominal pain.  ?Genitourinary:  Positive for dysuria, frequency and urgency. Negative for difficulty urinating, flank pain, hematuria, pelvic pain and vaginal discharge.  ?     Urinary odor ?  ?Musculoskeletal:  Positive for back pain (bil lower back pain but also ongoing back pain from outdoor work prior this.). Negative for arthralgias.  ? ?  ?Objective:  ?  ?Physical Exam ?Constitutional:   ?   General: She is not in acute distress. ?   Appearance: Normal appearance. She is normal weight. She is not ill-appearing, toxic-appearing or diaphoretic.  ?Pulmonary:  ?   Effort: Pulmonary effort is normal.  ?Neurological:  ?   Mental Status: She is alert.  ? ? ?BP 130/76   Pulse 65   Temp 98.5 ?F (36.9 ?C)   Resp 16   Ht 5' 7.75" (1.721 m)   Wt 164 lb 5 oz (74.5 kg)   SpO2 98%   BMI 25.17 kg/m?  ?Wt Readings from Last 3 Encounters:  ?05/16/21 164 lb 5 oz (74.5 kg)  ?05/12/21 164 lb 6.4 oz (74.6 kg)  ?11/10/20 155 lb 2 oz (70.4 kg)  ? ? ? ?There are no preventive care reminders to display for this patient. ? ?There are no preventive care reminders to display for this patient. ? ?Lab Results  ?Component Value Date  ? TSH 4.18 06/21/2020  ? ?Lab Results  ?Component Value Date  ? WBC 6.3 06/21/2020  ? HGB 14.6 06/21/2020  ? HCT 41.5 06/21/2020  ? MCV 97.2 06/21/2020  ? PLT 235.0 06/21/2020  ? ?Lab Results  ?Component Value Date  ? NA 141 06/21/2020  ? K 4.0 06/21/2020  ? CO2 27 06/21/2020  ? GLUCOSE 101 (H) 06/21/2020  ? BUN 11 06/21/2020  ? CREATININE 0.94 06/21/2020  ? BILITOT 0.8 06/21/2020  ? ALKPHOS 61 06/21/2020  ? AST 14 06/21/2020  ? ALT 13 06/21/2020  ? PROT 6.7 06/21/2020  ? ALBUMIN 4.3 06/21/2020  ? CALCIUM 9.4 06/21/2020  ? GFR 62.27 06/21/2020  ? ?No results found for: HGBA1C ? ?  ?Assessment & Plan:  ? ?Problem List Items Addressed This Visit   ? ?  ? Genitourinary  ? Acute cystitis with hematuria  ?  antbx sent to  pharmacy, pt to take as directed. Encouraged increased water intake throughout the day. Urine culture/reflex pending results. Choosing  to treat due to being symptomatic. If no improvement in the next 2 days pt advised to let me know. ? ? ?  ?  ? Relevant Medications  ? sulfamethoxazole-trimethoprim (BACTRIM DS) 800-160 MG tablet  ?  ? Other  ? Hematuria  ?  Sending urinalysis for review ?Reviewed poct urine ? ?  ?  ? Urinary frequency - Primary  ?  poct urine dipstick  ?Urinalysis as well as urine culture ordered pending results.  ? ?  ?  ? Relevant Orders  ? Urinalysis, Routine w reflex microscopic  ? Urine Culture  ? POCT Urinalysis Dipstick (Automated) (Completed)  ? ? ?Meds ordered this encounter  ?Medications  ? sulfamethoxazole-trimethoprim (BACTRIM DS) 800-160 MG tablet  ?  Sig: Take 1 tablet by mouth 2 (two) times daily for 7 days.  ?  Dispense:  14 tablet  ?  Refill:  0  ?  Order Specific Question:   Supervising Provider  ?  Answer:   BEDSOLE, AMY E [[id number]]  ? ? ?Follow-up: Return if symptoms worsen or fail to improve with pcp.  ? ? ?Mort Sawyers, FNP ?

## 2021-05-16 NOTE — Patient Instructions (Signed)
It was a pleasure seeing you today.   You were found to have a urinary tract infection, you have been prescribed an antibiotic to your preferred pharmacy. Please start antibiotic today as directed.   We are sending your urine for a culture to make sure you do not have a resistant bacteria. We will call you if we need to change your medications.   Please make sure you are drinking plenty of fluids over the next few days.  If your symptoms do not improve over the next 5-7 days, or if they worsen, please let us know. Please also let us know if you have worsening back pain, fevers, chills, or body aches.   Regards,   Neco Kling  

## 2021-05-16 NOTE — Assessment & Plan Note (Signed)
Sending urinalysis for review ?Reviewed poct urine ?

## 2021-05-18 LAB — URINE CULTURE
MICRO NUMBER:: 13340042
SPECIMEN QUALITY:: ADEQUATE

## 2021-05-29 ENCOUNTER — Encounter: Payer: Self-pay | Admitting: Family Medicine

## 2021-05-29 ENCOUNTER — Ambulatory Visit (INDEPENDENT_AMBULATORY_CARE_PROVIDER_SITE_OTHER): Payer: Medicare Other | Admitting: Family Medicine

## 2021-05-29 VITALS — BP 118/70 | HR 57 | Temp 97.7°F | Ht 67.75 in | Wt 162.6 lb

## 2021-05-29 DIAGNOSIS — N952 Postmenopausal atrophic vaginitis: Secondary | ICD-10-CM

## 2021-05-29 NOTE — Assessment & Plan Note (Signed)
Significant improvement since starting estrace. Resolution of symptoms. Advised continuing estrace and follow-up annually or prn.  ?

## 2021-05-29 NOTE — Progress Notes (Signed)
? ?  Subjective:  ? ?  ?Lisa Medina is a 71 y.o. female presenting for Follow-up (For estradiol cream ) ?  ? ? ?HPI ? ?#vaginal atrophy ?- tried premarin 2 years ago but too $$$ ?- came in with severe symptoms on 4/28 and was started on estrace ?- has been using this  ?- then got a urine infection and saw Tabitha - which worked well ?- the cream is working well ?- has done 5 doses ? ?No vaginal discharge or odor ?No burning with urination - symptoms resolved ? ? ?Review of Systems ? ?05/12/2021: Clinic - estrace vaginal cream ?05/16/2021: Clinic - UTI - bactrim ? ?Social History  ? ?Tobacco Use  ?Smoking Status Former  ? Years: 15.00  ? Types: Cigarettes  ? Quit date: 01/16/1992  ? Years since quitting: 29.3  ?Smokeless Tobacco Never  ? ? ? ?   ?Objective:  ?  ?BP Readings from Last 3 Encounters:  ?05/29/21 118/70  ?05/16/21 130/76  ?05/12/21 138/70  ? ?Wt Readings from Last 3 Encounters:  ?05/29/21 162 lb 9 oz (73.7 kg)  ?05/16/21 164 lb 5 oz (74.5 kg)  ?05/12/21 164 lb 6.4 oz (74.6 kg)  ? ? ?BP 118/70   Pulse (!) 57   Temp 97.7 ?F (36.5 ?C) (Temporal)   Ht 5' 7.75" (1.721 m)   Wt 162 lb 9 oz (73.7 kg)   SpO2 97%   BMI 24.90 kg/m?  ? ? ?Physical Exam ?Constitutional:   ?   General: She is not in acute distress. ?   Appearance: She is well-developed. She is not diaphoretic.  ?HENT:  ?   Right Ear: External ear normal.  ?   Left Ear: External ear normal.  ?   Nose: Nose normal.  ?Eyes:  ?   Conjunctiva/sclera: Conjunctivae normal.  ?Cardiovascular:  ?   Rate and Rhythm: Normal rate.  ?Pulmonary:  ?   Effort: Pulmonary effort is normal.  ?Musculoskeletal:  ?   Cervical back: Neck supple.  ?Skin: ?   General: Skin is warm and dry.  ?   Capillary Refill: Capillary refill takes less than 2 seconds.  ?Neurological:  ?   Mental Status: She is alert. Mental status is at baseline.  ?Psychiatric:     ?   Mood and Affect: Mood normal.     ?   Behavior: Behavior normal.  ? ? ? ? ? ?   ?Assessment & Plan:  ? ?Problem List  Items Addressed This Visit   ? ?  ? Genitourinary  ? Vaginal atrophy - Primary  ?  Significant improvement since starting estrace. Resolution of symptoms. Advised continuing estrace and follow-up annually or prn.  ? ?  ?  ? ? ? ?Return in about 5 months (around 10/29/2021) for annual. ? ?Lesleigh Noe, MD ? ? ? ?

## 2021-06-07 ENCOUNTER — Encounter: Payer: Self-pay | Admitting: Family Medicine

## 2021-06-08 ENCOUNTER — Ambulatory Visit
Admission: RE | Admit: 2021-06-08 | Discharge: 2021-06-08 | Disposition: A | Discharge status: Home / Self Care | Payer: Medicare Other | Source: Ambulatory Visit | Attending: Family Medicine | Admitting: Family Medicine

## 2021-06-08 ENCOUNTER — Ambulatory Visit (INDEPENDENT_AMBULATORY_CARE_PROVIDER_SITE_OTHER): Payer: Medicare Other | Admitting: Family Medicine

## 2021-06-08 VITALS — BP 100/70 | HR 65 | Temp 97.6°F | Ht 67.75 in | Wt 160.5 lb

## 2021-06-08 DIAGNOSIS — K5730 Diverticulosis of large intestine without perforation or abscess without bleeding: Secondary | ICD-10-CM | POA: Diagnosis not present

## 2021-06-08 DIAGNOSIS — R1031 Right lower quadrant pain: Secondary | ICD-10-CM | POA: Diagnosis not present

## 2021-06-08 DIAGNOSIS — R102 Pelvic and perineal pain: Secondary | ICD-10-CM | POA: Diagnosis not present

## 2021-06-08 DIAGNOSIS — K8020 Calculus of gallbladder without cholecystitis without obstruction: Secondary | ICD-10-CM | POA: Diagnosis not present

## 2021-06-08 DIAGNOSIS — K5792 Diverticulitis of intestine, part unspecified, without perforation or abscess without bleeding: Secondary | ICD-10-CM

## 2021-06-08 LAB — CBC WITH DIFFERENTIAL/PLATELET
Basophils Absolute: 0 10*3/uL (ref 0.0–0.1)
Basophils Relative: 0.4 % (ref 0.0–3.0)
Eosinophils Absolute: 0.1 10*3/uL (ref 0.0–0.7)
Eosinophils Relative: 1.3 % (ref 0.0–5.0)
HCT: 37.9 % (ref 36.0–46.0)
Hemoglobin: 13 g/dL (ref 12.0–15.0)
Lymphocytes Relative: 20.6 % (ref 12.0–46.0)
Lymphs Abs: 1.5 10*3/uL (ref 0.7–4.0)
MCHC: 34.3 g/dL (ref 30.0–36.0)
MCV: 95.6 fl (ref 78.0–100.0)
Monocytes Absolute: 0.7 10*3/uL (ref 0.1–1.0)
Monocytes Relative: 8.7 % (ref 3.0–12.0)
Neutro Abs: 5.2 10*3/uL (ref 1.4–7.7)
Neutrophils Relative %: 69 % (ref 43.0–77.0)
Platelets: 219 10*3/uL (ref 150.0–400.0)
RBC: 3.96 Mil/uL (ref 3.87–5.11)
RDW: 12.9 % (ref 11.5–15.5)
WBC: 7.5 10*3/uL (ref 4.0–10.5)

## 2021-06-08 LAB — POCT URINALYSIS DIPSTICK
Bilirubin, UA: NEGATIVE
Blood, UA: POSITIVE
Glucose, UA: NEGATIVE
Ketones, UA: NEGATIVE
Leukocytes, UA: NEGATIVE
Nitrite, UA: NEGATIVE
Protein, UA: NEGATIVE
Spec Grav, UA: 1.015 (ref 1.010–1.025)
Urobilinogen, UA: 0.2 E.U./dL
pH, UA: 5.5 (ref 5.0–8.0)

## 2021-06-08 LAB — COMPREHENSIVE METABOLIC PANEL
ALT: 11 U/L (ref 0–35)
AST: 16 U/L (ref 0–37)
Albumin: 4.1 g/dL (ref 3.5–5.2)
Alkaline Phosphatase: 69 U/L (ref 39–117)
BUN: 10 mg/dL (ref 6–23)
CO2: 31 mEq/L (ref 19–32)
Calcium: 9.4 mg/dL (ref 8.4–10.5)
Chloride: 103 mEq/L (ref 96–112)
Creatinine, Ser: 0.97 mg/dL (ref 0.40–1.20)
GFR: 59.56 mL/min — ABNORMAL LOW (ref >60.00)
Glucose, Bld: 92 mg/dL (ref 70–99)
Potassium: 3.7 mEq/L (ref 3.5–5.1)
Sodium: 140 mEq/L (ref 135–145)
Total Bilirubin: 0.7 mg/dL (ref 0.2–1.2)
Total Protein: 7.1 g/dL (ref 6.0–8.3)

## 2021-06-08 MED ORDER — IOPAMIDOL (ISOVUE-300) INJECTION 61%
100.0000 mL | Freq: Once | INTRAVENOUS | Status: AC | PRN
  Administered 2021-06-08: 100 mL via INTRAVENOUS

## 2021-06-08 MED ORDER — AMOXICILLIN-POT CLAVULANATE 875-125 MG PO TABS: 1.0000 | ORAL_TABLET | Freq: Two times a day (BID) | ORAL | 0 refills | Status: AC

## 2021-06-08 NOTE — Addendum Note (Signed)
Addended by: Lesleigh Noe on: 06/08/2021 02:59 PM   Modules accepted: Orders

## 2021-06-08 NOTE — Assessment & Plan Note (Signed)
Patient with history of diverticulosis, given fever and abdominal pain this is certainly a possibility.  However with right lower quadrant pain that initially was more midline concerning for appendicitis.  Discussed getting stat CT to evaluate for this.  Get blood work due to her recent fever.  Treat with antibiotics versus surgery pending CT scan report

## 2021-06-08 NOTE — Patient Instructions (Addendum)
walk in at the [facility]  Do not eat anything until after scan, can have clear liquids  Diverticulitis  - Liquid diet - can take ibuprofen or tylenol for pain  Augmentin x 10 days if not improving or worsening

## 2021-06-08 NOTE — Progress Notes (Addendum)
Subjective:     Lisa Medina is a 71 y.o. female presenting for Bloated (X 2 days. Having normal BM's ), Abdominal Cramping (X 2 days ), and Fever (101 yesterday )     Abdominal Cramping Associated symptoms include constipation and a fever. Pertinent negatives include no diarrhea, nausea or vomiting.  Fever  Associated symptoms include abdominal pain. Pertinent negatives include no diarrhea, nausea or vomiting.   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  Gastrointestinal:  Positive for abdominal pain and constipation. Negative for diarrhea, nausea and vomiting.    Social History   Tobacco Use  Smoking Status Former   Years: 15.00   Types: Cigarettes   Quit date: 01/16/1992   Years since quitting: 29.4  Smokeless Tobacco Never        Objective:    BP Readings from Last 3 Encounters:  06/08/21 100/70  05/29/21 118/70  05/16/21 130/76   Wt Readings from Last 3 Encounters:  06/08/21 160 lb 8 oz (72.8 kg)  05/29/21 162 lb 9 oz (73.7 kg)  05/16/21 164 lb 5 oz (74.5 kg)    BP 100/70   Pulse 65   Temp 97.6 F (36.4 C) (Temporal)   Ht 5' 7.75" (1.721 m)   Wt 160 lb 8 oz (72.8 kg)   SpO2 98%   BMI 24.58 kg/m    Physical Exam Constitutional:      General: She is not in acute distress.    Appearance: She is well-developed. She is not diaphoretic.  HENT:     Right Ear: External ear normal.     Left Ear: External ear normal.     Nose: Nose normal.  Eyes:     Conjunctiva/sclera: Conjunctivae normal.  Cardiovascular:     Rate and Rhythm: Normal rate and regular rhythm.      Heart sounds: No murmur heard. Pulmonary:     Effort: Pulmonary effort is normal. No respiratory distress.     Breath sounds: Normal breath sounds. No wheezing or rales.  Abdominal:     Tenderness: There is abdominal tenderness in the right lower quadrant and suprapubic area. There is no right CVA tenderness, left CVA tenderness, guarding or rebound. Positive signs include McBurney's sign.  Musculoskeletal:     Cervical back: Neck supple.  Skin:    General: Skin is warm and dry.     Capillary Refill: Capillary refill takes less than 2 seconds.  Neurological:     Mental Status: She is alert. Mental status is at baseline.  Psychiatric:        Mood and Affect: Mood normal.        Behavior: Behavior normal.          Assessment & Plan:   Problem List Items Addressed This Visit       Other   Right lower quadrant abdominal pain - Primary    Patient with history of diverticulosis, given fever and abdominal pain this is certainly a possibility.  However with right lower quadrant pain that initially was more midline concerning for appendicitis.  Discussed getting stat CT to evaluate for this.  Get blood work due to her recent fever.  Treat with antibiotics versus surgery pending CT scan report       Relevant Orders   CT ABDOMEN PELVIS W CONTRAST (Completed)   POCT urinalysis dipstick (Completed)   Comprehensive metabolic panel   CBC with Differential   Other Visit Diagnoses     Suprapubic abdominal pain       Relevant Orders   POCT urinalysis dipstick (Completed)   Acute diverticulitis       Relevant Medications   amoxicillin-clavulanate (AUGMENTIN) 875-125 MG tablet   Gall bladder stones       Relevant Orders   Ambulatory referral to General Surgery      UA without infection  Addendum: CT scan without signs of appendicitis.  But with diverticulitis.  Given fevers will prescribe antibiotics for patient to take if symptoms do not improve in the next 24 hours.  ER  precautions.  Addendum: Does get RUQ pain after eating. None today. Will refer to Gen Surgery  Return if symptoms worsen or fail to improve.  Lesleigh Noe, MD

## 2021-06-08 NOTE — Addendum Note (Signed)
Addended by: Lesleigh Noe on: 06/08/2021 02:51 PM   Modules accepted: Orders

## 2021-06-08 NOTE — Telephone Encounter (Signed)
[name] - Client TELEPHONE ADVICE RECORD [facility] Patient Name: Lisa Medina Gender: Female DOB: [birth year] Age: 71 Y 59 M 27 D Return Phone Number: [phone] (Primary) Address: City/ State/ Zip: [place]  [address] Client [facility] - Client Client Site [facility] Day Provider Waunita Schooner- MD Contact Type Call Who Is Calling Patient / Member / Family / Caregiver Call Type Triage / Clinical Caller Name Minette Brine Relationship To Patient Other Return Phone Number [phone] (Primary) Chief Complaint Abdominal Pain Reason for Call Symptomatic / Request for [place] states that she is calling from the office with a patient on the line and needs to know if she should go to a er or be seen at the office. SX fever 99.2 , abdomen pain with bloating and cramping. Translation No Nurse Assessment Nurse: Rolin Barry, RN, Levada Dy Date/Time Lisa Medina Time): 06/08/2021 10:03:05 AM Confirm and document reason for call. If symptomatic, describe symptoms. ---SX fever 99.2 , abdomen pain with bloating and cramping. Caller was 101 yesterday. Abdominal pressure, constant. Does the patient have any new or worsening symptoms? ---Yes Will a triage be completed? ---Yes Related visit to physician within the last 2 weeks? ---No Does the PT have any chronic conditions? (i.e. diabetes, asthma, this includes High risk factors for pregnancy, etc.) ---No Is this a behavioral health or substance abuse call? ---No Guidelines Guideline Title Affirmed Question Affirmed Notes Nurse Date/Time Lisa Medina Time) Abdominal Pain - Female [1] MILDMODERATE pain AND [2] constant AND [3] present > 2 hours Deaton, RN, Levada Dy 06/08/2021 10:04:49 AM Disp. Time Lisa Medina Time) Disposition Final User 06/08/2021 10:07:39 AM Send To RN Personal Deaton, RN, Maryjo Rochester NOTE: All timestamps  contained within this report are represented as [place] Standard Time. CONFIDENTIALTY NOTICE: This fax transmission is intended only for the addressee. It contains information that is legally privileged, confidential or otherwise protected from use or disclosure. If you are not the intended recipient, you are strictly prohibited from reviewing, disclosing, copying using or disseminating any of this information or taking any action in reliance on or regarding this information. If you have received this fax in error, please notify Lisa Medina immediately by telephone so that we Medina arrange for its return to Lisa Medina. Phone: [phone], Toll-Free: [phone], Fax: [phone] Page: 2 of 2 Call Id: [id number] [place]. Time Lisa Medina Time) Disposition Final User 06/08/2021 10:08:00 AM Send To RN Personal Deaton, RN, Levada Dy 06/08/2021 10:07:02 AM See HCP within 4 Hours (or PCP triage) Yes Deaton, RN, Cindee Lame Disagree/Comply Comply Caller Understands Yes PreDisposition Did not know what to do Care Advice Given Per Guideline SEE HCP (OR PCP TRIAGE) WITHIN 4 HOURS: * IF OFFICE WILL BE OPEN: You need to be seen within the next 3 or 4 hours. Call your doctor (or NP/PA) now or as soon as the office opens. NOTHING BY MOUTH: * Do not eat or drink anything for now. CALL BACK IF: * You become worse CARE ADVICE given per Abdominal Pain - Female (Adult) guideline. Comments User: Saverio Danker, RN Date/Time Lisa Medina Time): 06/08/2021 10:12:46 AM Spoke back with Minette Brine at the office, appt made in office for 11:20am. Referrals REFERRED TO PCP OFFIC

## 2021-06-08 NOTE — Telephone Encounter (Signed)
Per appt notes pt has already been arrived to see Dr Einar Pheasant this morning. Sending note to Dr Einar Pheasant and [facility] CMA.

## 2021-06-08 NOTE — Telephone Encounter (Signed)
Pt called wanted to "ask and see if Dr. Einar Pheasant or the nurse has looked at her MyChart message since yesterday '@6'$ :67 PM. She stated her symptoms are still occuring but are not as bad now, her fever has gone down to 99.2 now. I transferred her to [facility], she wants to know if she needs to come in to get seen here or if she needs to go to Urgent Care." Please return a call back when possible, thanks.  Callback Number: [phone]

## 2021-06-08 NOTE — Telephone Encounter (Signed)
See note from today

## 2021-06-15 DIAGNOSIS — K5792 Diverticulitis of intestine, part unspecified, without perforation or abscess without bleeding: Secondary | ICD-10-CM

## 2021-06-15 HISTORY — DX: Diverticulitis of intestine, part unspecified, without perforation or abscess without bleeding: K57.92

## 2021-06-20 ENCOUNTER — Encounter: Payer: Self-pay | Admitting: Surgery

## 2021-06-20 ENCOUNTER — Ambulatory Visit (INDEPENDENT_AMBULATORY_CARE_PROVIDER_SITE_OTHER): Payer: Medicare Other | Admitting: Surgery

## 2021-06-20 ENCOUNTER — Ambulatory Visit: Payer: Self-pay | Admitting: Surgery

## 2021-06-20 ENCOUNTER — Telehealth: Payer: Self-pay | Admitting: Surgery

## 2021-06-20 VITALS — BP 132/74 | HR 63 | Temp 98.1°F | Ht 68.0 in | Wt 158.2 lb

## 2021-06-20 DIAGNOSIS — K5732 Diverticulitis of large intestine without perforation or abscess without bleeding: Secondary | ICD-10-CM | POA: Insufficient documentation

## 2021-06-20 DIAGNOSIS — K8010 Calculus of gallbladder with chronic cholecystitis without obstruction: Secondary | ICD-10-CM | POA: Insufficient documentation

## 2021-06-20 DIAGNOSIS — K219 Gastro-esophageal reflux disease without esophagitis: Secondary | ICD-10-CM

## 2021-06-20 NOTE — Patient Instructions (Addendum)
Our surgery scheduler Pamala Hurry will call you within 24-48 hours to get you scheduled. If you have not heard from her after 48 hours, please call our office. Have the blue sheet available when she calls to write down important information.   If you have any concerns or questions, please feel free to call our office.   Cholelithiasis  Cholelithiasis happens when gallstones form in the gallbladder. The gallbladder stores bile. Bile is a fluid that helps digest fats. Bile can harden and form into gallstones. If they cause a blockage, they can cause pain (gallbladder attack). What are the causes? This condition may be caused by: Some blood diseases, such as sickle cell anemia. Too much of a fat-like substance (cholesterol) in your bile. Not enough bile salts in your bile. These salts help the body absorb and digest fats. The gallbladder not emptying fully or often enough. This is common in pregnant women. What increases the risk? The following factors may make you more likely to develop this condition: Being female. Being pregnant many times. Eating a lot of fried foods, fat, and refined carbs (refined carbohydrates). Being very overweight (obese). Being older than age 43. Using medicines with female hormones in them for a long time. Losing weight fast. Having gallstones in your family. Having some health problems, such as diabetes, Crohn's disease, or liver disease. What are the signs or symptoms? Often, there may be gallstones but no symptoms. These gallstones are called silent gallstones. If a gallstone causes a blockage, you may get sudden pain. The pain: Can be in the upper right part of your belly (abdomen). Normally comes at night or after you eat. Can last an hour or more. Can spread to your right shoulder, back, or chest. Can feel like discomfort, burning, or fullness in the upper part of your belly (indigestion). If the blockage lasts more than a few hours, you can get an  infection or swelling. You may: Feel like you may vomit. Vomit. Feel bloated. Have belly pain for 5 hours or more. Feel tender in your belly, often in the upper right part and under your ribs. Have fever or chills. Have skin or the white parts of your eyes turn yellow (jaundice). Have dark pee (urine) or pale poop (stool). How is this treated? Treatment for this condition depends on how bad you feel. If you have symptoms, you may need: Home care, if symptoms are not very bad. Do not eat for 12-24 hours. Drink only water and clear liquids. Start to eat simple or clear foods after 1 or 2 days. Try broths and crackers. You may need medicines for pain or stomach upset or both. If you have an infection, you will need antibiotics. A hospital stay, if you have very bad pain or a very bad infection. Surgery to remove your gallbladder. You may need this if: Gallstones keep coming back. You have very bad symptoms. Medicines to break up gallstones. Medicines: Are best for small gallstones. May be used for up to 6-12 months. A procedure to find and take out gallstones or to break up gallstones. Follow these instructions at home: Medicines Take over-the-counter and prescription medicines only as told by your doctor. If you were prescribed an antibiotic medicine, take it as told by your doctor. Do not stop taking the antibiotic even if you start to feel better. Ask your doctor if the medicine prescribed to you requires you to avoid driving or using machinery. Eating and drinking Drink enough fluid to keep your urine  pale yellow. Drink water or clear fluids. This is important when you have pain. Eat healthy foods. Choose: Fewer fatty foods, such as fried foods. Fewer refined carbs. Avoid breads and grains that are highly processed, such as white bread and white rice. Choose whole grains, such as whole-wheat bread and brown rice. More fiber. Almonds, fresh fruit, and beans are healthy  sources. General instructions Keep a healthy weight. Keep all follow-up visits as told by your doctor. This is important. Where to find more information [employer] of Diabetes and Digestive and Kidney Diseases: [email or web address] Contact a doctor if: You have sudden pain in the upper right part of your belly. Pain might spread to your right shoulder, back, or chest. You have been diagnosed with gallstones that have no symptoms and you get: Belly pain. Discomfort, burning, or fullness in the upper part of your abdomen. You have dark urine or pale stools. Get help right away if: You have sudden pain in the upper right part of your abdomen, and the pain lasts more than 2 hours. You have pain in your abdomen, and: It lasts more than 5 hours. It keeps getting worse. You have a fever or chills. You keep feeling like you may vomit. You keep vomiting. Your skin or the white parts of your eyes turn yellow. Summary Cholelithiasis happens when gallstones form in the gallbladder. This condition may be caused by a blood disease, too much of a fat-like substance in the bile, or not enough bile salts in bile. Treatment for this condition depends on how bad you feel. If you have symptoms, do not eat or drink. You may need medicines. You may need a hospital stay for very bad pain or a very bad infection. You may need surgery if gallstones keep coming back or if you have very bad symptoms. This information is not intended to replace advice given to you by your health care provider. Make sure you discuss any questions you have with your health care provider.  Gallbladder Eating Plan High blood cholesterol, obesity, a sedentary lifestyle, an unhealthy diet, and diabetes are risk factors for developing gallstones. If you have a gallbladder condition, you may have trouble digesting fats and tolerating high fat intake. Eating a low-fat diet can help reduce your symptoms and may be helpful before and  after having surgery to remove your gallbladder (cholecystectomy). Your health care provider may recommend that you work with a dietitian to help you reduce the amount of fat in your diet. What are tips for following this plan? General guidelines Limit your fat intake to less than 30% of your total daily calories. If you eat around 1,800 calories each day, this means eating less than 60 grams (g) of fat per day. Fat is an important part of a healthy diet. Eating a low-fat diet can make it hard to maintain a healthy body weight. Ask your dietitian how much fat, calories, and other nutrients you need each day. Eat small, frequent meals throughout the day instead of three large meals. Drink at least 8-10 cups (1.9-2.4 L) of fluid a day. Drink enough fluid to keep your urine pale yellow. If you drink alcohol: Limit how much you have to: 0-1 drink a day for women who are not pregnant. 0-2 drinks a day for men. Know how much alcohol is in a drink. In the [place] one drink equals one 12 oz bottle of beer (355 mL), one 5 oz glass of wine (148 mL), or one 1 oz glass  of hard liquor (44 mL). Reading food labels  Check nutrition facts on food labels for the amount of fat per serving. Choose foods with less than 3 grams of fat per serving. Shopping Choose nonfat and low-fat healthy foods. Look for the words "nonfat," "low-fat," or "fat-free." Avoid buying processed or prepackaged foods. Cooking Cook using low-fat methods, such as baking, broiling, grilling, or boiling. Cook with small amounts of healthy fats, such as olive oil, grapeseed oil, canola oil, avocado oil, or sunflower oil. What foods are recommended?  All fresh, frozen, or canned fruits and vegetables. Whole grains. Low-fat or nonfat (skim) milk and yogurt. Lean meat, skinless poultry, fish, eggs, and beans. Low-fat protein supplement powders or drinks. Spices and herbs. The items listed above may not be a complete list of foods and  beverages you can eat and drink. Contact a dietitian for more information. What foods are not recommended? High-fat foods. These include baked goods, fast food, fatty cuts of meat, ice cream, french toast, sweet rolls, pizza, cheese bread, foods covered with butter, creamy sauces, or cheese. Fried foods. These include french fries, tempura, battered fish, breaded chicken, fried breads, and sweets. Foods that cause bloating and gas. The items listed above may not be a complete list of foods that you should avoid. Contact a dietitian for more information. Summary A low-fat diet can be helpful if you have a gallbladder condition, or before and after gallbladder surgery. Limit your fat intake to less than 30% of your total daily calories. This is about 60 g of fat if you eat 1,800 calories each day. Eat small, frequent meals throughout the day instead of three large meals. This information is not intended to replace advice given to you by your health care provider. Make sure you discuss any questions you have with your health care provider. Document Revised: 12/16/2020 Document Reviewed: 12/16/2020 Elsevier Patient Education  [place].

## 2021-06-20 NOTE — H&P (View-Only) (Signed)
Patient ID: Lisa Medina, female   DOB: 1950-11-10, 71 y.o.   MRN: [id number]  Chief Complaint: Epigastric pain/discomfort.  History of Present Illness Lisa Medina is a 71 y.o. female with recent CT scan examination for some lower abdominal discomfort/pain.  This led to the diagnosis of diverticulitis, for which she is taking Augmentin.  Incidental finding was gallstones on her CT scan.  She has a history of epigastric pain and known GERD.  She has actually been upped on her PPI intake recently, noting that she has most of her epigastric discomfort following late evening meals, most notably pizza.  She does not definitively note that greasy or more fried foods bother her.  And so her symptoms therefore difficult to discern from her GERD symptoms. No history of jaundice or hepatitis, no history of right sided radiation.  She does report there is some radiation to the back from the epigastric pain centrally.  Past Medical History Past Medical History:  Diagnosis Date   GERD (gastroesophageal reflux disease)    Hypertension    Urinary tract bacterial infections       Past Surgical History:  Procedure Laterality Date   COLONOSCOPY  2003, 2008   UPPER GI ENDOSCOPY      Allergies  Allergen Reactions   Macrobid [Nitrofurantoin] Swelling    Current Outpatient Medications  Medication Sig Dispense Refill   Ca Phosphate-Cholecalciferol (CALCIUM 500 + D3) 250-500 MG-UNIT CHEW Chew by mouth daily.     esomeprazole (NEXIUM) 20 MG capsule Take 20 mg by mouth daily at 12 noon.     estradiol (ESTRACE VAGINAL) 0.1 MG/GM vaginal cream Use a pea sized amount in vagina and on vulva twice weekly 42.5 g 0   IRON, FERROUS SULFATE, PO Take 45 mg by mouth daily.     LORazepam (ATIVAN) 0.5 MG tablet Take 0.5-1 tablets (0.25-0.5 mg total) by mouth daily as needed for anxiety. 30 tablet 0   ondansetron (ZOFRAN) 4 MG tablet Take 1 tablet (4 mg total) by mouth every 8 (eight) hours as needed for nausea or  vomiting. 10 tablet 0   traZODone (DESYREL) 50 MG tablet Take 0.5-1 tablets (25-50 mg total) by mouth at bedtime as needed for sleep. 30 tablet 3   No current facility-administered medications for this visit.    Family History Family History  Problem Relation Age of Onset   Hyperlipidemia Mother    Stroke Mother 70   Hypertension Mother    Heart disease Father    Hypertension Father    Hypertension Brother    Hypertension Maternal Grandmother    Hypertension Maternal Grandfather    Hypertension Paternal Grandmother    Hypertension Paternal Grandfather    Colon cancer Neg Hx    Esophageal cancer Neg Hx    Rectal cancer Neg Hx    Stomach cancer Neg Hx    Breast cancer Neg Hx       Social History Social History   Tobacco Use   Smoking status: Former    Years: 15.00    Types: Cigarettes    Quit date: 01/16/1992    Years since quitting: 29.4   Smokeless tobacco: Never  Vaping Use   Vaping Use: Never used  Substance Use Topics   Alcohol use: Yes    Alcohol/week: 2.0 standard drinks    Types: 2 Glasses of wine per week    Comment: 4 times a week, 1 glass   Drug use: No       Review  of Systems  Constitutional:  Positive for malaise/fatigue.  HENT: Negative.    Eyes: Negative.   Respiratory: Negative.    Cardiovascular: Negative.   Gastrointestinal:  Positive for abdominal pain, diarrhea and heartburn.  Genitourinary:  Positive for frequency.  Skin: Negative.   Neurological: Negative.   Psychiatric/Behavioral: Negative.       Physical Exam Blood pressure 132/74, pulse 63, temperature 98.1 F (36.7 C), temperature source Oral, height 5\' 8"  (1.727 m), weight 158 lb 3.2 oz (71.8 kg), SpO2 97 %. Last Weight  Most recent update: 06/20/2021  9:52 AM    Weight  71.8 kg (158 lb 3.2 oz)             CONSTITUTIONAL: Well developed, and nourished, appropriately responsive and aware without distress.   EYES: Sclera non-icteric.   EARS, NOSE, MOUTH AND THROAT: The  oropharynx is clear. Oral mucosa is pink and moist. Hearing is intact to voice.  NECK: Trachea is midline, and there is no jugular venous distension.  LYMPH NODES:  Lymph nodes in the neck are not enlarged. RESPIRATORY:  Lungs are clear, and breath sounds are equal bilaterally. Normal respiratory effort without pathologic use of accessory muscles. CARDIOVASCULAR: Heart is regular in rate and rhythm. GI: The abdomen is soft, nontender, and nondistended. There were no palpable masses. I did not appreciate hepatosplenomegaly. There were normal bowel sounds. MUSCULOSKELETAL:  Symmetrical muscle tone appreciated in all four extremities.    SKIN: Skin turgor is normal. No pathologic skin lesions appreciated.  NEUROLOGIC:  Motor and sensation appear grossly normal.  Cranial nerves are grossly without defect. PSYCH:  Alert and oriented to person, place and time. Affect is appropriate for situation.  Data Reviewed I have personally reviewed what is currently available of the patient's imaging, recent labs and medical records.   Labs:     Latest Ref Rng & Units 06/08/2021   12:19 PM 06/21/2020    9:40 AM 11/23/2019    9:09 AM  CBC  WBC 4.0 - 10.5 K/uL 7.5   6.3   5.0    Hemoglobin 12.0 - 15.0 g/dL 09.8   11.9   14.7    Hematocrit 36.0 - 46.0 % 37.9   41.5   41.6    Platelets 150.0 - 400.0 K/uL 219.0   235.0   237.0        Latest Ref Rng & Units 06/08/2021   12:19 PM 06/21/2020    9:40 AM 11/23/2019    9:09 AM  CMP  Glucose 70 - 99 mg/dL 92   829   86    BUN 6 - 23 mg/dL 10   11   14     Creatinine 0.40 - 1.20 mg/dL 5.62   1.30   8.65    Sodium 135 - 145 mEq/L 140   141   140    Potassium 3.5 - 5.1 mEq/L 3.7   4.0   4.2    Chloride 96 - 112 mEq/L 103   105   103    CO2 19 - 32 mEq/L 31   27   31     Calcium 8.4 - 10.5 mg/dL 9.4   9.4   9.2    Total Protein 6.0 - 8.3 g/dL 7.1   6.7   6.8    Total Bilirubin 0.2 - 1.2 mg/dL 0.7   0.8   0.7    Alkaline Phos 39 - 117 U/L 69   61   75    AST  0 - 37  U/L 16   14   24     ALT 0 - 35 U/L 11   13   29       Imaging: Radiology review:   CLINICAL DATA:  Upper quadrant abdominal pain x2 days with low-grade fever nausea concern for diverticulitis.   EXAM: CT ABDOMEN AND PELVIS WITH CONTRAST   TECHNIQUE: Multidetector CT imaging of the abdomen and pelvis was performed using the standard protocol following bolus administration of intravenous contrast.   RADIATION DOSE REDUCTION: This exam was performed according to the departmental dose-optimization program which includes automated exposure control, adjustment of the mA and/or kV according to patient size and/or use of iterative reconstruction technique.   Creatinine was obtained on site at [facility] at [address]   Results: Creatinine 1.1 mg/dL.   CONTRAST:  ISOVUE-300 IOPAMIDOL (ISOVUE-300) INJECTION 61%   COMPARISON:  None Available.   FINDINGS: Lower chest: No acute abnormality.   Hepatobiliary: Hypodense 11 mm lesion in the posterior right lobe of the liver measures fluid density consistent with a benign renal cysts. Additional scattered millimetric hypodense hepatic lesions are technically too small to accurately characterize but statistically likely to reflect cysts. Cholelithiasis with mild gallbladder wall thickening. No biliary ductal dilation.   Pancreas: No pancreatic ductal dilation or evidence of acute inflammation.   Spleen: No splenomegaly or focal splenic lesion.   Adrenals/Urinary Tract: Adrenal glands are unremarkable. Kidneys are normal, without renal calculi, suspicious renal lesion, or hydronephrosis. Bladder is unremarkable degree of distension.   Stomach/Bowel: Radiopaque enteric contrast material administered. Wall thickening versus underdistention of the gastric antrum. Duodenal diverticula. Small appendicoliths in a non inflamed appendix. Colonic diverticulosis with acute uncomplicated sigmoid colonic  diverticulitis.   Vascular/Lymphatic: Aortic atherosclerosis without abdominal aortic aneurysm. No pathologically enlarged abdominal or pelvic lymph nodes.   Reproductive: Uterus and bilateral adnexa are unremarkable.   Other: No significant abdominopelvic free fluid. No pneumoperitoneum.   Musculoskeletal: L2-L3 discogenic disease. No acute osseous abnormality.   IMPRESSION: 1. Acute uncomplicated sigmoid colonic diverticulitis. 2. Cholelithiasis with mild gallbladder wall thickening. Correlate for RUQ tenderness to palpation 3. Wall thickening versus underdistention of the gastric antrum. Correlate for gastritis. 4. Aortic Atherosclerosis (ICD10-I70.0).     Electronically Signed   By: Maudry Mayhew M.D.   On: 06/08/2021 13:58   Within last 24 hrs: No results found.  Assessment    Cholelithiasis, possible/probable cholecystitis contributing to GERD symptoms. Recent episode of acute diverticulitis, finishing course of Augmentin. Patient Active Problem List   Diagnosis Date Noted   Right lower quadrant abdominal pain 06/08/2021   Vaginal discomfort 05/14/2021   Insomnia 03/01/2020   Vaginal atrophy 09/14/2019   Osteopenia of neck of femur 09/09/2018   Anxiety 08/23/2016   HTN (hypertension) 11/18/2013   GERD (gastroesophageal reflux disease) 11/18/2013   Post-menopausal 11/18/2013    Plan    Robotic cholecystectomy with ICG imaging.  This was discussed thoroughly.  Optimal plan is for robotic cholecystectomy.  We cautioned that removing the gallbladder will only remove symptoms associated with it directly, however may improve her GERD symptoms.  Risks and benefits have been discussed with the patient which include but are not limited to anesthesia, bleeding, infection, biliary ductal injury or stenosis, other associated unanticipated injuries affiliated with laparoscopic surgery.  I believe there is the desire to proceed, interpreter utilized as needed.  Questions  elicited and answered to satisfaction.  No guarantees ever expressed or implied.   Face-to-face time  spent with the patient and accompanying care providers(if present) was 30 minutes, with more than 50% of the time spent counseling, educating, and coordinating care of the patient.    These notes generated with voice recognition software. I apologize for typographical errors.  Campbell Lerner M.D., FACS 06/20/2021, 10:25 AM

## 2021-06-20 NOTE — Progress Notes (Addendum)
Patient ID: Lisa Medina, female   DOB: 1950-11-10, 71 y.o.   MRN: [id number]  Chief Complaint: Epigastric pain/discomfort.  History of Present Illness Lisa Medina is a 71 y.o. female with recent CT scan examination for some lower abdominal discomfort/pain.  This led to the diagnosis of diverticulitis, for which she is taking Augmentin.  Incidental finding was gallstones on her CT scan.  She has a history of epigastric pain and known GERD.  She has actually been upped on her PPI intake recently, noting that she has most of her epigastric discomfort following late evening meals, most notably pizza.  She does not definitively note that greasy or more fried foods bother her.  And so her symptoms therefore difficult to discern from her GERD symptoms. No history of jaundice or hepatitis, no history of right sided radiation.  She does report there is some radiation to the back from the epigastric pain centrally.  Past Medical History Past Medical History:  Diagnosis Date   GERD (gastroesophageal reflux disease)    Hypertension    Urinary tract bacterial infections       Past Surgical History:  Procedure Laterality Date   COLONOSCOPY  2003, 2008   UPPER GI ENDOSCOPY      Allergies  Allergen Reactions   Macrobid [Nitrofurantoin] Swelling    Current Outpatient Medications  Medication Sig Dispense Refill   Ca Phosphate-Cholecalciferol (CALCIUM 500 + D3) 250-500 MG-UNIT CHEW Chew by mouth daily.     esomeprazole (NEXIUM) 20 MG capsule Take 20 mg by mouth daily at 12 noon.     estradiol (ESTRACE VAGINAL) 0.1 MG/GM vaginal cream Use a pea sized amount in vagina and on vulva twice weekly 42.5 g 0   IRON, FERROUS SULFATE, PO Take 45 mg by mouth daily.     LORazepam (ATIVAN) 0.5 MG tablet Take 0.5-1 tablets (0.25-0.5 mg total) by mouth daily as needed for anxiety. 30 tablet 0   ondansetron (ZOFRAN) 4 MG tablet Take 1 tablet (4 mg total) by mouth every 8 (eight) hours as needed for nausea or  vomiting. 10 tablet 0   traZODone (DESYREL) 50 MG tablet Take 0.5-1 tablets (25-50 mg total) by mouth at bedtime as needed for sleep. 30 tablet 3   No current facility-administered medications for this visit.    Family History Family History  Problem Relation Age of Onset   Hyperlipidemia Mother    Stroke Mother 70   Hypertension Mother    Heart disease Father    Hypertension Father    Hypertension Brother    Hypertension Maternal Grandmother    Hypertension Maternal Grandfather    Hypertension Paternal Grandmother    Hypertension Paternal Grandfather    Colon cancer Neg Hx    Esophageal cancer Neg Hx    Rectal cancer Neg Hx    Stomach cancer Neg Hx    Breast cancer Neg Hx       Social History Social History   Tobacco Use   Smoking status: Former    Years: 15.00    Types: Cigarettes    Quit date: 01/16/1992    Years since quitting: 29.4   Smokeless tobacco: Never  Vaping Use   Vaping Use: Never used  Substance Use Topics   Alcohol use: Yes    Alcohol/week: 2.0 standard drinks    Types: 2 Glasses of wine per week    Comment: 4 times a week, 1 glass   Drug use: No       Review  of Systems  Constitutional:  Positive for malaise/fatigue.  HENT: Negative.    Eyes: Negative.   Respiratory: Negative.    Cardiovascular: Negative.   Gastrointestinal:  Positive for abdominal pain, diarrhea and heartburn.  Genitourinary:  Positive for frequency.  Skin: Negative.   Neurological: Negative.   Psychiatric/Behavioral: Negative.       Physical Exam Blood pressure 132/74, pulse 63, temperature 98.1 F (36.7 C), temperature source Oral, height 5\' 8"  (1.727 m), weight 158 lb 3.2 oz (71.8 kg), SpO2 97 %. Last Weight  Most recent update: 06/20/2021  9:52 AM    Weight  71.8 kg (158 lb 3.2 oz)             CONSTITUTIONAL: Well developed, and nourished, appropriately responsive and aware without distress.   EYES: Sclera non-icteric.   EARS, NOSE, MOUTH AND THROAT: The  oropharynx is clear. Oral mucosa is pink and moist. Hearing is intact to voice.  NECK: Trachea is midline, and there is no jugular venous distension.  LYMPH NODES:  Lymph nodes in the neck are not enlarged. RESPIRATORY:  Lungs are clear, and breath sounds are equal bilaterally. Normal respiratory effort without pathologic use of accessory muscles. CARDIOVASCULAR: Heart is regular in rate and rhythm. GI: The abdomen is soft, nontender, and nondistended. There were no palpable masses. I did not appreciate hepatosplenomegaly. There were normal bowel sounds. MUSCULOSKELETAL:  Symmetrical muscle tone appreciated in all four extremities.    SKIN: Skin turgor is normal. No pathologic skin lesions appreciated.  NEUROLOGIC:  Motor and sensation appear grossly normal.  Cranial nerves are grossly without defect. PSYCH:  Alert and oriented to person, place and time. Affect is appropriate for situation.  Data Reviewed I have personally reviewed what is currently available of the patient's imaging, recent labs and medical records.   Labs:     Latest Ref Rng & Units 06/08/2021   12:19 PM 06/21/2020    9:40 AM 11/23/2019    9:09 AM  CBC  WBC 4.0 - 10.5 K/uL 7.5   6.3   5.0    Hemoglobin 12.0 - 15.0 g/dL 09.8   11.9   14.7    Hematocrit 36.0 - 46.0 % 37.9   41.5   41.6    Platelets 150.0 - 400.0 K/uL 219.0   235.0   237.0        Latest Ref Rng & Units 06/08/2021   12:19 PM 06/21/2020    9:40 AM 11/23/2019    9:09 AM  CMP  Glucose 70 - 99 mg/dL 92   829   86    BUN 6 - 23 mg/dL 10   11   14     Creatinine 0.40 - 1.20 mg/dL 5.62   1.30   8.65    Sodium 135 - 145 mEq/L 140   141   140    Potassium 3.5 - 5.1 mEq/L 3.7   4.0   4.2    Chloride 96 - 112 mEq/L 103   105   103    CO2 19 - 32 mEq/L 31   27   31     Calcium 8.4 - 10.5 mg/dL 9.4   9.4   9.2    Total Protein 6.0 - 8.3 g/dL 7.1   6.7   6.8    Total Bilirubin 0.2 - 1.2 mg/dL 0.7   0.8   0.7    Alkaline Phos 39 - 117 U/L 69   61   75    AST  0 - 37  U/L 16   14   24     ALT 0 - 35 U/L 11   13   29       Imaging: Radiology review:   CLINICAL DATA:  Upper quadrant abdominal pain x2 days with low-grade fever nausea concern for diverticulitis.   EXAM: CT ABDOMEN AND PELVIS WITH CONTRAST   TECHNIQUE: Multidetector CT imaging of the abdomen and pelvis was performed using the standard protocol following bolus administration of intravenous contrast.   RADIATION DOSE REDUCTION: This exam was performed according to the departmental dose-optimization program which includes automated exposure control, adjustment of the mA and/or kV according to patient size and/or use of iterative reconstruction technique.   Creatinine was obtained on site at [facility] at [address]   Results: Creatinine 1.1 mg/dL.   CONTRAST:  ISOVUE-300 IOPAMIDOL (ISOVUE-300) INJECTION 61%   COMPARISON:  None Available.   FINDINGS: Lower chest: No acute abnormality.   Hepatobiliary: Hypodense 11 mm lesion in the posterior right lobe of the liver measures fluid density consistent with a benign renal cysts. Additional scattered millimetric hypodense hepatic lesions are technically too small to accurately characterize but statistically likely to reflect cysts. Cholelithiasis with mild gallbladder wall thickening. No biliary ductal dilation.   Pancreas: No pancreatic ductal dilation or evidence of acute inflammation.   Spleen: No splenomegaly or focal splenic lesion.   Adrenals/Urinary Tract: Adrenal glands are unremarkable. Kidneys are normal, without renal calculi, suspicious renal lesion, or hydronephrosis. Bladder is unremarkable degree of distension.   Stomach/Bowel: Radiopaque enteric contrast material administered. Wall thickening versus underdistention of the gastric antrum. Duodenal diverticula. Small appendicoliths in a non inflamed appendix. Colonic diverticulosis with acute uncomplicated sigmoid colonic  diverticulitis.   Vascular/Lymphatic: Aortic atherosclerosis without abdominal aortic aneurysm. No pathologically enlarged abdominal or pelvic lymph nodes.   Reproductive: Uterus and bilateral adnexa are unremarkable.   Other: No significant abdominopelvic free fluid. No pneumoperitoneum.   Musculoskeletal: L2-L3 discogenic disease. No acute osseous abnormality.   IMPRESSION: 1. Acute uncomplicated sigmoid colonic diverticulitis. 2. Cholelithiasis with mild gallbladder wall thickening. Correlate for RUQ tenderness to palpation 3. Wall thickening versus underdistention of the gastric antrum. Correlate for gastritis. 4. Aortic Atherosclerosis (ICD10-I70.0).     Electronically Signed   By: Maudry Mayhew M.D.   On: 06/08/2021 13:58   Within last 24 hrs: No results found.  Assessment    Cholelithiasis, possible/probable cholecystitis contributing to GERD symptoms. Recent episode of acute diverticulitis, finishing course of Augmentin. Patient Active Problem List   Diagnosis Date Noted   Right lower quadrant abdominal pain 06/08/2021   Vaginal discomfort 05/14/2021   Insomnia 03/01/2020   Vaginal atrophy 09/14/2019   Osteopenia of neck of femur 09/09/2018   Anxiety 08/23/2016   HTN (hypertension) 11/18/2013   GERD (gastroesophageal reflux disease) 11/18/2013   Post-menopausal 11/18/2013    Plan    Robotic cholecystectomy with ICG imaging.  This was discussed thoroughly.  Optimal plan is for robotic cholecystectomy.  We cautioned that removing the gallbladder will only remove symptoms associated with it directly, however may improve her GERD symptoms.  Risks and benefits have been discussed with the patient which include but are not limited to anesthesia, bleeding, infection, biliary ductal injury or stenosis, other associated unanticipated injuries affiliated with laparoscopic surgery.  I believe there is the desire to proceed, interpreter utilized as needed.  Questions  elicited and answered to satisfaction.  No guarantees ever expressed or implied.   Face-to-face time  spent with the patient and accompanying care providers(if present) was 30 minutes, with more than 50% of the time spent counseling, educating, and coordinating care of the patient.    These notes generated with voice recognition software. I apologize for typographical errors.  Campbell Lerner M.D., FACS 06/20/2021, 10:25 AM

## 2021-06-20 NOTE — Telephone Encounter (Signed)
Patient has been advised of Pre-Admission date/time, COVID Testing date and Surgery date.  Surgery Date: 07/05/21 Preadmission Testing Date: 06/28/21 (phone 8a-1p) Covid Testing Date: Not needed.    Patient has been made aware to call [phone], between 1-3:00pm the day before surgery, to find out what time to arrive for surgery.

## 2021-06-28 ENCOUNTER — Encounter
Admission: RE | Admit: 2021-06-28 | Discharge: 2021-06-28 | Disposition: A | Discharge status: Home / Self Care | Payer: Medicare Other | Source: Ambulatory Visit | Attending: Surgery | Admitting: Surgery

## 2021-06-28 DIAGNOSIS — Z01818 Encounter for other preprocedural examination: Secondary | ICD-10-CM

## 2021-06-28 HISTORY — DX: Endocarditis, valve unspecified: I38

## 2021-06-28 HISTORY — DX: Anemia, unspecified: D64.9

## 2021-06-28 NOTE — Patient Instructions (Addendum)
Your procedure is scheduled on:07-05-21 Wednesday Report to the Registration Desk on the 1st floor of the [facility].Then proceed to the 2nd floor Surgery Desk To find out your arrival time, please call [phone] between 1PM - 3PM on:07-04-21 Tuesday If your arrival time is 6:00 am, do not arrive prior to that time as the [facility] entrance doors do not open until 6:00 am.  REMEMBER: Instructions that are not followed completely may result in serious medical risk, up to and including death; or upon the discretion of your surgeon and anesthesiologist your surgery may need to be rescheduled.  Do not eat food OR drink any liquids after midnight the night before surgery.  No gum chewing, lozengers or hard candies.  TAKE THESE MEDICATIONS THE MORNING OF SURGERY WITH A SIP OF WATER: -esomeprazole (NEXIUM) -You may take your LORazepam (ATIVAN) the morning of surgery if needed for anxiety  One week prior to surgery: Stop Anti-inflammatories (NSAIDS) such as Advil, Aleve, Ibuprofen, Motrin, Naproxen, Naprosyn and Aspirin based products such as Excedrin, Goodys Powder, BC Powder.You may however, take Tylenol if needed for pain up until the day of surgery. Stop ANY OVER THE COUNTER supplements/vitamins NOW (06-29-21) until after surgery.  No Alcohol for 24 hours before or after surgery.  No Smoking including e-cigarettes for 24 hours prior to surgery.  No chewable tobacco products for at least 6 hours prior to surgery.  No nicotine patches on the day of surgery.  Do not use any "recreational" drugs for at least a week prior to your surgery.  Please be advised that the combination of cocaine and anesthesia may have negative outcomes, up to and including death. If you test positive for cocaine, your surgery will be cancelled.  On the morning of surgery brush your teeth with toothpaste and water, you may rinse your mouth with mouthwash if you wish. Do not swallow any toothpaste or  mouthwash.  Use CHG Soap as directed on instruction sheet.  Do not wear jewelry, make-up, hairpins, clips or nail polish.  Do not wear lotions, powders, or perfumes.   Do not shave body from the neck down 48 hours prior to surgery just in case you cut yourself which could leave a site for infection.  Also, freshly shaved skin may become irritated if using the CHG soap.  Contact lenses, hearing aids and dentures may not be worn into surgery.  Do not bring valuables to the hospital. [facility] is not responsible for any missing/lost belongings or valuables.   Notify your doctor if there is any change in your medical condition (cold, fever, infection).  Wear comfortable clothing (specific to your surgery type) to the hospital.  After surgery, you can help prevent lung complications by doing breathing exercises.  Take deep breaths and cough every 1-2 hours. Your doctor may order a device called an Incentive Spirometer to help you take deep breaths. When coughing or sneezing, hold a pillow firmly against your incision with both hands. This is called "splinting." Doing this helps protect your incision. It also decreases belly discomfort.  If you are being admitted to the hospital overnight, leave your suitcase in the car. After surgery it may be brought to your room.  If you are being discharged the day of surgery, you will not be allowed to drive home. You will need a responsible adult (18 years or older) to drive you home and stay with you that night.   If you are taking public transportation, you will need  to have a responsible adult (18 years or older) with you. Please confirm with your physician that it is acceptable to use public transportation.   Please call the [facility] at [phone] if you have any questions about these instructions.  Surgery Visitation Policy:  Patients undergoing a surgery or procedure may have two family members or support persons  with them as long as the person is not COVID-19 positive or experiencing its symptoms.

## 2021-06-29 ENCOUNTER — Other Ambulatory Visit: Payer: Medicare Other

## 2021-06-29 ENCOUNTER — Encounter
Admission: RE | Admit: 2021-06-29 | Discharge: 2021-06-29 | Disposition: A | Discharge status: Home / Self Care | Payer: Medicare Other | Source: Ambulatory Visit | Attending: Surgery | Admitting: Surgery

## 2021-06-29 DIAGNOSIS — Z01810 Encounter for preprocedural cardiovascular examination: Secondary | ICD-10-CM | POA: Diagnosis not present

## 2021-06-29 DIAGNOSIS — Z01818 Encounter for other preprocedural examination: Secondary | ICD-10-CM

## 2021-07-04 MED ORDER — ACETAMINOPHEN 500 MG PO TABS: 1000.0000 mg | ORAL_TABLET | ORAL | Status: AC

## 2021-07-04 MED ORDER — ORAL CARE MOUTH RINSE: 15.0000 mL | Freq: Once | OROMUCOSAL | Status: AC

## 2021-07-04 MED ORDER — CELECOXIB 200 MG PO CAPS: 200.0000 mg | ORAL_CAPSULE | ORAL | Status: AC

## 2021-07-04 MED ORDER — GABAPENTIN 300 MG PO CAPS: 300.0000 mg | ORAL_CAPSULE | ORAL | Status: AC

## 2021-07-04 MED ORDER — CEFAZOLIN SODIUM-DEXTROSE 2-4 GM/100ML-% IV SOLN
2.0000 g | INTRAVENOUS | Status: AC
  Administered 2021-07-05: 2 g via INTRAVENOUS

## 2021-07-04 MED ORDER — CHLORHEXIDINE GLUCONATE CLOTH 2 % EX PADS
6.0000 | MEDICATED_PAD | Freq: Once | CUTANEOUS | Status: AC
  Administered 2021-07-05: 6 via TOPICAL

## 2021-07-04 MED ORDER — LACTATED RINGERS IV SOLN: INTRAVENOUS | Status: DC

## 2021-07-04 MED ORDER — CHLORHEXIDINE GLUCONATE CLOTH 2 % EX PADS: 6.0000 | MEDICATED_PAD | Freq: Once | CUTANEOUS | Status: DC

## 2021-07-04 MED ORDER — BUPIVACAINE LIPOSOME 1.3 % IJ SUSP: 20.0000 mL | Freq: Once | INTRAMUSCULAR | Status: DC

## 2021-07-04 MED ORDER — CHLORHEXIDINE GLUCONATE 0.12 % MT SOLN: 15.0000 mL | Freq: Once | OROMUCOSAL | Status: AC

## 2021-07-04 MED ORDER — INDOCYANINE GREEN 25 MG IV SOLR
1.2500 mg | Freq: Once | INTRAVENOUS | Status: AC
  Administered 2021-07-05: 1.25 mg via INTRAVENOUS
  Filled 2021-07-04: qty 0.5

## 2021-07-05 ENCOUNTER — Encounter
Admission: RE | Disposition: A | Discharge status: Home / Self Care | Payer: Self-pay | Source: Home / Self Care | Attending: Surgery

## 2021-07-05 ENCOUNTER — Other Ambulatory Visit: Payer: Self-pay

## 2021-07-05 ENCOUNTER — Ambulatory Visit: Payer: Medicare Other | Admitting: Certified Registered"

## 2021-07-05 ENCOUNTER — Ambulatory Visit
Admission: RE | Admit: 2021-07-05 | Discharge: 2021-07-05 | Disposition: A | Discharge status: Home / Self Care | Payer: Medicare Other | Attending: Surgery | Admitting: Surgery

## 2021-07-05 ENCOUNTER — Encounter: Payer: Self-pay | Admitting: Surgery

## 2021-07-05 DIAGNOSIS — I10 Essential (primary) hypertension: Secondary | ICD-10-CM | POA: Diagnosis not present

## 2021-07-05 DIAGNOSIS — K5792 Diverticulitis of intestine, part unspecified, without perforation or abscess without bleeding: Secondary | ICD-10-CM | POA: Diagnosis not present

## 2021-07-05 DIAGNOSIS — K8010 Calculus of gallbladder with chronic cholecystitis without obstruction: Secondary | ICD-10-CM | POA: Diagnosis not present

## 2021-07-05 DIAGNOSIS — K219 Gastro-esophageal reflux disease without esophagitis: Secondary | ICD-10-CM | POA: Diagnosis not present

## 2021-07-05 SURGERY — CHOLECYSTECTOMY, ROBOT-ASSISTED, LAPAROSCOPIC
Anesthesia: General | Site: Abdomen

## 2021-07-05 MED ORDER — BUPIVACAINE-EPINEPHRINE 0.25% -1:200000 IJ SOLN
INTRAMUSCULAR | Status: DC | PRN
  Administered 2021-07-05: 50 mL

## 2021-07-05 MED ORDER — DEXAMETHASONE SODIUM PHOSPHATE 10 MG/ML IJ SOLN
INTRAMUSCULAR | Status: DC | PRN
  Administered 2021-07-05: 10 mg via INTRAVENOUS

## 2021-07-05 MED ORDER — LACTATED RINGERS IV SOLN: INTRAVENOUS | Status: DC | PRN

## 2021-07-05 MED ORDER — ACETAMINOPHEN 500 MG PO TABS
ORAL_TABLET | ORAL | Status: AC
  Administered 2021-07-05: 1000 mg via ORAL
  Filled 2021-07-05: qty 2

## 2021-07-05 MED ORDER — ROCURONIUM BROMIDE 100 MG/10ML IV SOLN
INTRAVENOUS | Status: DC | PRN
  Administered 2021-07-05: 50 mg via INTRAVENOUS

## 2021-07-05 MED ORDER — LIDOCAINE HCL (CARDIAC) PF 100 MG/5ML IV SOSY
PREFILLED_SYRINGE | INTRAVENOUS | Status: DC | PRN
  Administered 2021-07-05: 50 mg via INTRAVENOUS

## 2021-07-05 MED ORDER — PROPOFOL 10 MG/ML IV BOLUS
INTRAVENOUS | Status: DC | PRN
  Administered 2021-07-05: 150 mg via INTRAVENOUS

## 2021-07-05 MED ORDER — OXYCODONE HCL 5 MG PO TABS
5.0000 mg | ORAL_TABLET | Freq: Once | ORAL | Status: AC
  Administered 2021-07-05: 5 mg via ORAL

## 2021-07-05 MED ORDER — BUPIVACAINE-EPINEPHRINE (PF) 0.25% -1:200000 IJ SOLN
INTRAMUSCULAR | Status: AC
  Filled 2021-07-05: qty 30

## 2021-07-05 MED ORDER — 0.9 % SODIUM CHLORIDE (POUR BTL) OPTIME
TOPICAL | Status: DC | PRN
  Administered 2021-07-05: 500 mL

## 2021-07-05 MED ORDER — OXYCODONE HCL 5 MG PO TABS
ORAL_TABLET | ORAL | Status: AC
  Filled 2021-07-05: qty 1

## 2021-07-05 MED ORDER — ONDANSETRON HCL 4 MG/2ML IJ SOLN
INTRAMUSCULAR | Status: AC
  Filled 2021-07-05: qty 2

## 2021-07-05 MED ORDER — GABAPENTIN 300 MG PO CAPS
ORAL_CAPSULE | ORAL | Status: AC
  Administered 2021-07-05: 300 mg via ORAL
  Filled 2021-07-05: qty 1

## 2021-07-05 MED ORDER — PHENYLEPHRINE HCL (PRESSORS) 10 MG/ML IV SOLN
INTRAVENOUS | Status: DC | PRN
  Administered 2021-07-05: 80 ug via INTRAVENOUS

## 2021-07-05 MED ORDER — CEFAZOLIN SODIUM-DEXTROSE 2-4 GM/100ML-% IV SOLN
INTRAVENOUS | Status: AC
  Filled 2021-07-05: qty 100

## 2021-07-05 MED ORDER — ONDANSETRON HCL 4 MG/2ML IJ SOLN
INTRAMUSCULAR | Status: DC | PRN
  Administered 2021-07-05: 4 mg via INTRAVENOUS

## 2021-07-05 MED ORDER — GLYCOPYRROLATE 0.2 MG/ML IJ SOLN
INTRAMUSCULAR | Status: DC | PRN
  Administered 2021-07-05: .2 mg via INTRAVENOUS

## 2021-07-05 MED ORDER — FENTANYL CITRATE (PF) 100 MCG/2ML IJ SOLN
INTRAMUSCULAR | Status: AC
  Filled 2021-07-05: qty 2

## 2021-07-05 MED ORDER — IBUPROFEN 800 MG PO TABS: 800.0000 mg | ORAL_TABLET | Freq: Three times a day (TID) | ORAL | 0 refills | Status: DC | PRN

## 2021-07-05 MED ORDER — CELECOXIB 200 MG PO CAPS
ORAL_CAPSULE | ORAL | Status: AC
  Administered 2021-07-05: 200 mg via ORAL
  Filled 2021-07-05: qty 1

## 2021-07-05 MED ORDER — CHLORHEXIDINE GLUCONATE 0.12 % MT SOLN
OROMUCOSAL | Status: AC
  Administered 2021-07-05: 15 mL via OROMUCOSAL
  Filled 2021-07-05: qty 15

## 2021-07-05 MED ORDER — FENTANYL CITRATE (PF) 100 MCG/2ML IJ SOLN: 25.0000 ug | INTRAMUSCULAR | Status: DC | PRN

## 2021-07-05 MED ORDER — FENTANYL CITRATE (PF) 100 MCG/2ML IJ SOLN
INTRAMUSCULAR | Status: DC | PRN
  Administered 2021-07-05 (×2): 50 ug via INTRAVENOUS

## 2021-07-05 MED ORDER — MIDAZOLAM HCL 2 MG/2ML IJ SOLN
INTRAMUSCULAR | Status: AC
  Filled 2021-07-05: qty 2

## 2021-07-05 MED ORDER — HYDROCODONE-ACETAMINOPHEN 5-325 MG PO TABS: 1.0000 | ORAL_TABLET | Freq: Four times a day (QID) | ORAL | 0 refills | Status: DC | PRN

## 2021-07-05 MED ORDER — PHENYLEPHRINE HCL-NACL 20-0.9 MG/250ML-% IV SOLN
INTRAVENOUS | Status: DC | PRN
  Administered 2021-07-05: 50 ug/min via INTRAVENOUS

## 2021-07-05 MED ORDER — FENTANYL CITRATE (PF) 100 MCG/2ML IJ SOLN
INTRAMUSCULAR | Status: AC
  Administered 2021-07-05: 25 ug via INTRAVENOUS
  Filled 2021-07-05: qty 2

## 2021-07-05 MED ORDER — ONDANSETRON HCL 4 MG/2ML IJ SOLN
4.0000 mg | Freq: Once | INTRAMUSCULAR | Status: AC | PRN
  Administered 2021-07-05: 4 mg via INTRAVENOUS

## 2021-07-05 MED ORDER — MIDAZOLAM HCL 2 MG/2ML IJ SOLN
INTRAMUSCULAR | Status: DC | PRN
  Administered 2021-07-05: 2 mg via INTRAVENOUS

## 2021-07-05 SURGICAL SUPPLY — 46 items
ADH SKN CLS APL DERMABOND .7 (GAUZE/BANDAGES/DRESSINGS) ×1
BAG PRESSURE INF REUSE 3000 (BAG) IMPLANT
CLIP LIGATING HEM O LOK PURPLE (MISCELLANEOUS) ×3 IMPLANT
COVER TIP SHEARS 8 DVNC (MISCELLANEOUS) ×1 IMPLANT
COVER TIP SHEARS 8MM DA VINCI (MISCELLANEOUS) ×2
DERMABOND ADVANCED (GAUZE/BANDAGES/DRESSINGS) ×1
DERMABOND ADVANCED .7 DNX12 (GAUZE/BANDAGES/DRESSINGS) ×1 IMPLANT
DRAPE ARM DVNC X/XI (DISPOSABLE) ×4 IMPLANT
DRAPE COLUMN DVNC XI (DISPOSABLE) ×1 IMPLANT
DRAPE DA VINCI XI ARM (DISPOSABLE) ×8
DRAPE DA VINCI XI COLUMN (DISPOSABLE) ×2
ELECT CAUTERY BLADE 6.4 (BLADE) ×2 IMPLANT
GLOVE ORTHO TXT STRL SZ7.5 (GLOVE) ×4 IMPLANT
GOWN STRL REUS W/ TWL LRG LVL3 (GOWN DISPOSABLE) ×2 IMPLANT
GOWN STRL REUS W/ TWL XL LVL3 (GOWN DISPOSABLE) ×2 IMPLANT
GOWN STRL REUS W/TWL LRG LVL3 (GOWN DISPOSABLE) ×4
GOWN STRL REUS W/TWL XL LVL3 (GOWN DISPOSABLE) ×4
GRASPER SUT TROCAR 14GX15 (MISCELLANEOUS) ×1 IMPLANT
IRRIGATION STRYKERFLOW (MISCELLANEOUS) IMPLANT
IRRIGATOR STRYKERFLOW (MISCELLANEOUS)
IRRIGATOR SUCT 8 DISP DVNC XI (IRRIGATION / IRRIGATOR) IMPLANT
IRRIGATOR SUCTION 8MM XI DISP (IRRIGATION / IRRIGATOR)
IV NS IRRIG 3000ML ARTHROMATIC (IV SOLUTION) IMPLANT
KIT PINK PAD W/HEAD ARE REST (MISCELLANEOUS) ×2
KIT PINK PAD W/HEAD ARM REST (MISCELLANEOUS) ×1 IMPLANT
KIT TURNOVER KIT A (KITS) ×2 IMPLANT
LABEL OR SOLS (LABEL) ×1 IMPLANT
MANIFOLD NEPTUNE II (INSTRUMENTS) ×2 IMPLANT
NDL INSUFFLATION 14GA 120MM (NEEDLE) IMPLANT
NEEDLE HYPO 22GX1.5 SAFETY (NEEDLE) ×2 IMPLANT
NEEDLE INSUFFLATION 14GA 120MM (NEEDLE) IMPLANT
NS IRRIG 500ML POUR BTL (IV SOLUTION) ×2 IMPLANT
PACK LAP CHOLECYSTECTOMY (MISCELLANEOUS) ×2 IMPLANT
PENCIL ELECTRO HAND CTR (MISCELLANEOUS) ×2 IMPLANT
SEAL CANN UNIV 5-8 DVNC XI (MISCELLANEOUS) ×4 IMPLANT
SEAL XI 5MM-8MM UNIVERSAL (MISCELLANEOUS) ×8
SET TUBE SMOKE EVAC HIGH FLOW (TUBING) ×2 IMPLANT
SOLUTION ELECTROLUBE (MISCELLANEOUS) ×2 IMPLANT
SPIKE FLUID TRANSFER (MISCELLANEOUS) ×2 IMPLANT
SUT MNCRL 4-0 (SUTURE) ×2
SUT MNCRL 4-0 27XMFL (SUTURE) ×1
SUT VICRYL 0 AB UR-6 (SUTURE) ×2 IMPLANT
SUTURE MNCRL 4-0 27XMF (SUTURE) ×1 IMPLANT
SYS BAG RETRIEVAL 10MM (BASKET) ×2
SYSTEM BAG RETRIEVAL 10MM (BASKET) ×1 IMPLANT
TROCAR Z-THREAD FIOS 11X100 BL (TROCAR) ×1 IMPLANT

## 2021-07-05 NOTE — Transfer of Care (Signed)
Immediate Anesthesia Transfer of Care Note  Patient: Lisa Medina  Procedure(s) Performed: XI ROBOTIC ASSISTED LAPAROSCOPIC CHOLECYSTECTOMY (Abdomen) INDOCYANINE GREEN FLUORESCENCE IMAGING (ICG) (Abdomen)  Patient Location: PACU  Anesthesia Type:General  Level of Consciousness: drowsy  Airway & Oxygen Therapy: Patient Spontanous Breathing  Post-op Assessment: Report given to RN and Post -op Vital signs reviewed and stable  Post vital signs: Reviewed and stable  Last Vitals:  Vitals Value Taken Time  BP 149/75 07/05/21 1100  Temp 36.2 C 07/05/21 1049  Pulse 85 07/05/21 1102  Resp 12 07/05/21 1102  SpO2 95 % 07/05/21 1102  Vitals shown include unvalidated device data.  Last Pain:  Vitals:   07/05/21 1049  TempSrc:   PainSc: Asleep         Complications: No notable events documented.

## 2021-07-05 NOTE — Interval H&P Note (Signed)
History and Physical Interval Note:  07/05/2021 9:03 AM  Lisa Medina  has presented today for surgery, with the diagnosis of Chronic calculous cholecystitis, diverticulitis of colon.  The various methods of treatment have been discussed with the patient and family. After consideration of risks, benefits and other options for treatment, the patient has consented to  Procedure(s): XI ROBOTIC ASSISTED LAPAROSCOPIC CHOLECYSTECTOMY (N/A) [place] (ICG) (N/A) as a surgical intervention.  The patient's history has been reviewed, patient examined, no change in status, stable for surgery.  I have reviewed the patient's chart and labs.  Questions were answered to the patient's satisfaction.     Ronny Bacon

## 2021-07-05 NOTE — Anesthesia Preprocedure Evaluation (Signed)
Anesthesia Evaluation  Patient identified by MRN, date of birth, ID band Patient awake    Reviewed: Allergy & Precautions, H&P , NPO status , Patient's Chart, lab work & pertinent test results, reviewed documented beta blocker date and time   History of Anesthesia Complications Negative for: history of anesthetic complications  Airway Mallampati: III  TM Distance: >3 FB Neck ROM: full    Dental  (+) Dental Advidsory Given, Caps, Teeth Intact   Pulmonary neg pulmonary ROS, former smoker,    Pulmonary exam normal breath sounds clear to auscultation       Cardiovascular Exercise Tolerance: Good hypertension (history of, but no meds x8 years), (-) angina(-) Past MI and (-) Cardiac Stents Normal cardiovascular exam(-) dysrhythmias + Valvular Problems/Murmurs  Rhythm:regular Rate:Normal     Neuro/Psych negative neurological ROS  negative psych ROS   GI/Hepatic Neg liver ROS, GERD  ,  Endo/Other  negative endocrine ROS  Renal/GU negative Renal ROS  negative genitourinary   Musculoskeletal   Abdominal   Peds  Hematology negative hematology ROS (+)   Anesthesia Other Findings Past Medical History: No date: Anemia     Comment:  borderline 06/2021: Diverticulitis No date: GERD (gastroesophageal reflux disease) No date: Hypertension     Comment:  off meds x 8 years now-bp controlled No date: Leaky heart valve No date: Urinary tract bacterial infections   Reproductive/Obstetrics negative OB ROS                             Anesthesia Physical Anesthesia Plan  ASA: 2  Anesthesia Plan: General   Post-op Pain Management:    Induction: Intravenous  PONV Risk Score and Plan: 3 and Ondansetron, Dexamethasone and Treatment may vary due to age or medical condition  Airway Management Planned: Oral ETT  Additional Equipment:   Intra-op Plan:   Post-operative Plan: Extubation in OR  Informed  Consent: I have reviewed the patients History and Physical, chart, labs and discussed the procedure including the risks, benefits and alternatives for the proposed anesthesia with the patient or authorized representative who has indicated his/her understanding and acceptance.     Dental Advisory Given  Plan Discussed with: Anesthesiologist, CRNA and Surgeon  Anesthesia Plan Comments:         Anesthesia Quick Evaluation

## 2021-07-05 NOTE — Discharge Instructions (Addendum)
AMBULATORY SURGERY  DISCHARGE INSTRUCTIONS   The drugs that you were given will stay in your system until tomorrow so for the next 24 hours you should not:  Drive an automobile Make any legal decisions Drink any alcoholic beverage   You may resume regular meals tomorrow.  Today it is better to start with liquids and gradually work up to solid foods.  You may eat anything you prefer, but it is better to start with liquids, then soup and crackers, and gradually work up to solid foods.   Please notify your doctor immediately if you have any unusual bleeding, trouble breathing, redness and pain at the surgery site, drainage, fever, or pain not relieved by medication.    Additional Instructions:  PLEASE LEAVE GREEN ARMBAND ON FOR 4 DAYS   Please contact your physician with any problems or Same Day Surgery at [phone], Monday through Friday 6 am to 4 pm, or [facility] [place] Main number at [phone]. 

## 2021-07-05 NOTE — Op Note (Signed)
Robotic cholecystectomy with Indocyamine Green Ductal Imaging.   Pre-operative Diagnosis: Chronic calculus cholecystitis  Post-operative Diagnosis:  Same.  Procedure: Robotic assisted laparoscopic cholecystectomy with Indocyamine Green Ductal Imaging.   Surgeon: Ronny Bacon, M.D., FACS  Anesthesia: General. with endotracheal tube  Findings: As expected, adhesions to gallbladder noted, pericystic lymph nodes  Estimated Blood Loss: 10 mL         Drains: None         Specimens: Gallbladder           Complications: none  Procedure Details  The patient was seen again in the Holding Room.  2.5 mg dose of ICG was administered intravenously.   The benefits, complications, treatment options, risks and expected outcomes were again reviewed with the patient. The likelihood of improving the patient's symptoms with return to their baseline status is good.  The patient and/or family concurred with the proposed plan, giving informed consent, again alternatives reviewed.  The patient was taken to Operating Room, identified, and the procedure verified as robotic assisted laparoscopic cholecystectomy.  Prior to the induction of general anesthesia, antibiotic prophylaxis was administered. VTE prophylaxis was in place. General endotracheal anesthesia was then administered and tolerated well. The patient was positioned in the supine position.  After the induction, the abdomen was prepped with Chloraprep and draped in the sterile fashion.  A Time Out was held and the above information confirmed.  Right infra-umbilical local infiltration with Exparel combined with quarter percent Marcaine with epinephrine is utilized at all port sites.  Made a 12 mm incision on the right periumbilical site, I advanced an optical 68m port under direct visualization into the peritoneal cavity.  Once the peritoneum was penetrated, insufflation was initiated.  The trocar was then advanced into the abdominal cavity under  direct visualization. Pneumoperitoneum was then continued with Air seal utilizing CO2 at 15 mmHg or less and tolerated well without any adverse changes in the patient's vital signs.  Two 8.5-mm ports were placed in the left lower quadrant and laterally, and one to the right lower quadrant, all under direct vision. All skin incisions  were infiltrated with a local anesthetic agent before making the incision and placing the trocars.  The patient was positioned  in reverse Trendelenburg, tilted the patient's left side down.  Da Vinci XI robot was then positioned on to the patient's left side, and docked.  The gallbladder was identified, the fundus grasped via the arm 4 Prograsp and retracted cephalad. Adhesions were lysed with scissors and cautery.  The infundibulum was identified grasped and retracted laterally, exposing the peritoneum overlying the triangle of Calot. This was then opened and dissected using cautery & scissors. An extended critical view of the cystic duct and cystic artery was obtained, aided by the ICG via FireFly which enabled ready visualization of the ductal anatomy.    The cystic duct was clearly identified and dissected to isolation.   Artery well isolated and clipped, and the cystic duct was triple clipped and divided with scissors, as close to the gallbladder neck as feasible, thus leaving two on the remaining stump.  The specimen side of the artery is sealed with bipolar and divided with monopolar scissors.   The gallbladder was taken from the gallbladder fossa in a retrograde fashion with the electrocautery. The gallbladder was removed and placed in an Endocatch bag.  The liver bed is inspected. Hemostasis was confirmed.  The robot was undocked and moved away from the operative field. No irrigation was utilized.  The gallbladder and Endocatch sac were then removed through the infraumbilical port site.   Inspection of the right upper quadrant was performed. No bleeding, bile duct  injury or leak, or bowel injury was noted. The infra-umbilical port site fascia was closed with interrumpted 0 Vicryl sutures using PMI/cone under direct visualization. Pneumoperitoneum was released and ports removed.  4-0 subcuticular Monocryl was used to close the skin. Dermabond was  applied.  The patient was then extubated and brought to the recovery room in stable condition. Sponge, lap, and needle counts were correct at closure and at the conclusion of the case.               Ronny Bacon, M.D., [facility] 07/05/2021 10:47 AM

## 2021-07-05 NOTE — Anesthesia Procedure Notes (Signed)
Procedure Name: Intubation Date/Time: 07/05/2021 9:43 AM  Performed by: Beverely Low, CRNAPre-anesthesia Checklist: Patient identified, Patient being monitored, Timeout performed, Emergency Drugs available and Suction available Patient Re-evaluated:Patient Re-evaluated prior to induction Oxygen Delivery Method: Circle system utilized Preoxygenation: Pre-oxygenation with 100% oxygen Induction Type: IV induction Ventilation: Mask ventilation without difficulty Laryngoscope Size: 3 and McGraph Grade View: Grade I Tube type: Oral Tube size: 7.0 mm Number of attempts: 1 Airway Equipment and Method: Stylet Placement Confirmation: ETT inserted through vocal cords under direct vision, positive ETCO2 and breath sounds checked- equal and bilateral Secured at: 21 cm Tube secured with: Tape Dental Injury: Teeth and Oropharynx as per pre-operative assessment

## 2021-07-06 LAB — SURGICAL PATHOLOGY

## 2021-07-20 ENCOUNTER — Encounter: Payer: Self-pay | Admitting: Physician Assistant

## 2021-07-20 ENCOUNTER — Ambulatory Visit (INDEPENDENT_AMBULATORY_CARE_PROVIDER_SITE_OTHER): Payer: Medicare Other | Admitting: Physician Assistant

## 2021-07-20 VITALS — BP 102/66 | HR 60 | Temp 98.6°F | Wt 156.0 lb

## 2021-07-20 DIAGNOSIS — K8010 Calculus of gallbladder with chronic cholecystitis without obstruction: Secondary | ICD-10-CM

## 2021-07-20 DIAGNOSIS — Z09 Encounter for follow-up examination after completed treatment for conditions other than malignant neoplasm: Secondary | ICD-10-CM

## 2021-07-20 NOTE — Patient Instructions (Signed)

## 2021-07-20 NOTE — Progress Notes (Signed)
[facility] POST-OP OFFICE VISIT  07/20/2021  HPI: Lisa Medina is a 71 y.o. female 15 days s/p robotic assisted laparoscopic cholecystectomy for [facility] with Dr Christian Mate   Doing well Pain significant improved after day 10; now only sore with certain movements No fever, chills, nausea, emesis She is tolerating a diet; avoiding fatty foods; no diarrhea No issues with incisions No other complaints   Vital signs: BP 102/66   Pulse 60   Temp 98.6 F (37 C) (Oral)   Wt 156 lb (70.8 kg)   SpO2 97%   BMI 23.72 kg/m    Physical Exam: Constitutional: Well appearing female, NAD Abdomen: Soft, non-tender, non-distended, no rebound/guarding Skin: Laparoscopic incisions are healing well, no erythema or drainage   Assessment/Plan: This is a 71 y.o. female 15 days s/p robotic assisted laparoscopic cholecystectomy for [facility]   - Pain control prn  - Reviewed wound care recommendation  - Reviewed lifting restrictions; 4 weeks total  - Reviewed surgical pathology; [place]  - She can follow up on as needed basis; She understands to call with questions/concerns  -- Edison Simon, PA-C [facility] 07/20/2021, 3:04 PM M-F: 7am - 4pm

## 2021-07-21 ENCOUNTER — Ambulatory Visit (INDEPENDENT_AMBULATORY_CARE_PROVIDER_SITE_OTHER): Payer: Medicare Other | Admitting: Internal Medicine

## 2021-07-21 ENCOUNTER — Encounter: Payer: Self-pay | Admitting: Internal Medicine

## 2021-07-21 VITALS — BP 126/84 | HR 64 | Temp 98.7°F | Ht 68.0 in | Wt 156.6 lb

## 2021-07-21 DIAGNOSIS — N3000 Acute cystitis without hematuria: Secondary | ICD-10-CM

## 2021-07-21 DIAGNOSIS — R350 Frequency of micturition: Secondary | ICD-10-CM

## 2021-07-21 LAB — POCT URINALYSIS DIPSTICK (MANUAL)
Nitrite, UA: NEGATIVE
Poct Bilirubin: NEGATIVE
Poct Glucose: NORMAL mg/dL
Poct Ketones: NEGATIVE
Poct Urobilinogen: NORMAL mg/dL
Spec Grav, UA: 1.01 (ref 1.010–1.025)
pH, UA: 6 (ref 5.0–8.0)

## 2021-07-21 MED ORDER — SULFAMETHOXAZOLE-TRIMETHOPRIM 800-160 MG PO TABS: 1.0000 | ORAL_TABLET | Freq: Two times a day (BID) | ORAL | 1 refills | Status: DC

## 2021-07-21 NOTE — Assessment & Plan Note (Signed)
Had Klebsiella last time with ampicillin resistance Will send culture again 3 days bactrim DS for now Discussed that vaginal estrogen will reduce her risk for more infections

## 2021-07-21 NOTE — Progress Notes (Signed)
Subjective:    Patient ID: Lisa Medina, female    DOB: Sep 03, 1950, 71 y.o.   MRN: [id number]  HPI Here due to urinary symptoms  Diagnosed with vaginal atrophy--so started on estradiol cream Shortly after--came in with cystitis Then in with abdominal pain---CT showed diverticulits but also gallstones Recently had gallbladder out--~2 weeks ago  Since getting home---wondered about the bladder again (but felt may be post op) Did notice abnormal odor--then improved Now for a few days, having mild dysuria No blood  Current Outpatient Medications on File Prior to Visit  Medication Sig Dispense Refill   Ca Phosphate-Cholecalciferol (CALCIUM 500 + D3) 250-500 MG-UNIT CHEW Chew 1 tablet by mouth as needed.     esomeprazole (NEXIUM) 20 MG capsule Take 20 mg by mouth 2 (two) times daily.     estradiol (ESTRACE VAGINAL) 0.1 MG/GM vaginal cream Use a pea sized amount in vagina and on vulva twice weekly 42.5 g 0   HYDROcodone-acetaminophen (NORCO/VICODIN) 5-325 MG tablet Take 1 tablet by mouth every 6 (six) hours as needed for moderate pain. 15 tablet 0   ibuprofen (ADVIL) 800 MG tablet Take 1 tablet (800 mg total) by mouth every 8 (eight) hours as needed. 30 tablet 0   LORazepam (ATIVAN) 0.5 MG tablet Take 0.5-1 tablets (0.25-0.5 mg total) by mouth daily as needed for anxiety. 30 tablet 0   ondansetron (ZOFRAN) 4 MG tablet Take 1 tablet (4 mg total) by mouth every 8 (eight) hours as needed for nausea or vomiting. 10 tablet 0   traZODone (DESYREL) 50 MG tablet Take 0.5-1 tablets (25-50 mg total) by mouth at bedtime as needed for sleep. 30 tablet 3   No current facility-administered medications on file prior to visit.    Allergies  Allergen Reactions   Macrobid [Nitrofurantoin] Swelling    Past Medical History:  Diagnosis Date   Anemia    borderline   Diverticulitis 06/2021   GERD (gastroesophageal reflux disease)    Hypertension    off meds x 8 years now-bp controlled   Leaky heart  valve    Urinary tract bacterial infections     Past Surgical History:  Procedure Laterality Date   COLONOSCOPY  2003, 2008   UPPER GI ENDOSCOPY      Family History  Problem Relation Age of Onset   Hyperlipidemia Mother    Stroke Mother 37   Hypertension Mother    Heart disease Father    Hypertension Father    Hypertension Brother    Hypertension Maternal Grandmother    Hypertension Maternal Grandfather    Hypertension Paternal Grandmother    Hypertension Paternal Grandfather    Colon cancer Neg Hx    Esophageal cancer Neg Hx    Rectal cancer Neg Hx    Stomach cancer Neg Hx    Breast cancer Neg Hx     Social History   Socioeconomic History   Marital status: Married    Spouse name: Harless Litten   Number of children: 0   Years of education: some college   Highest education level: Not on file  Occupational History   Not on file  Tobacco Use   Smoking status: Former    Packs/day: 0.25    Years: 15.00    Total pack years: 3.75    Types: Cigarettes    Quit date: 01/16/1992    Years since quitting: 29.5   Smokeless tobacco: Never  Vaping Use   Vaping Use: Never used  Substance and Sexual Activity  Alcohol use: Yes    Alcohol/week: 2.0 standard drinks of alcohol    Types: 2 Glasses of wine per week    Comment: 1-2 times weekly   Drug use: No   Sexual activity: Yes    Birth control/protection: Post-menopausal  Other Topics Concern   Not on file  Social History Narrative   Full code- desires CPR but does not want prolonged life support if futile.   09/14/19   From: [place]    Living: with husband, Maurine Minister (1997, but together since 1985)   Work: retired, works part-time - Airline pilot and use taxes      Family: brother in [place] and brother in [place]      Enjoys: exercise      Exercise: working out 3 times a week- weights and swimming   Diet: fair - breakfast, lunch, and light dinner      Safety   Seat belts: Yes    Guns: No   Safe in  relationships: Yes    Social Determinants of Corporate investment banker Strain: Not on [employer] Insecurity: Not on file  Transportation Needs: Not on file  Physical Activity: Not on file  Stress: Not on file  Social Connections: Not on file  Intimate Partner Violence: Not on file   Review of Systems Slight nausea---since the cholecystectomy. No vomiting Appetite is okay--increasing variety now    Objective:   Physical Exam Constitutional:      Appearance: Normal appearance.  Abdominal:     Palpations: Abdomen is soft.     Tenderness: There is no abdominal tenderness.     Comments: Some urgency with palpation in suprapubic area  Neurological:     Mental Status: She is alert.            Assessment & Plan:

## 2021-07-23 LAB — UNLABELED: Test Ordered On Req: 395

## 2021-08-03 ENCOUNTER — Telehealth: Payer: Self-pay | Admitting: *Deleted

## 2021-08-03 ENCOUNTER — Telehealth: Payer: Medicare Other | Admitting: Family Medicine

## 2021-08-03 NOTE — Telephone Encounter (Signed)
Patient called and had surgery on 07/05/21 Dr Christian Mate lap Lisa Medina and she sees a suture sticking out about 1/2 long. She wants to know if this normal. Please call and advise

## 2021-08-03 NOTE — Telephone Encounter (Signed)
Patient notified that this is a dissolvable suture. She was told she could come in to have one of the nurses clip this off for her. She states that she will do this herself. She will call back with any further questions.

## 2021-08-07 ENCOUNTER — Other Ambulatory Visit: Payer: Self-pay | Admitting: Family Medicine

## 2021-09-20 ENCOUNTER — Telehealth: Payer: Self-pay | Admitting: Family Medicine

## 2021-09-20 DIAGNOSIS — E2839 Other primary ovarian failure: Secondary | ICD-10-CM

## 2021-09-20 NOTE — Telephone Encounter (Signed)
Lilia Pro from [facility] called and she needs orders signed on epic for bone density test that patient is having on tomorrow.

## 2021-09-21 ENCOUNTER — Ambulatory Visit
Admission: RE | Admit: 2021-09-21 | Discharge: 2021-09-21 | Disposition: A | Discharge status: Home / Self Care | Payer: Medicare Other | Source: Ambulatory Visit | Attending: Family Medicine | Admitting: Family Medicine

## 2021-09-21 DIAGNOSIS — M85851 Other specified disorders of bone density and structure, right thigh: Secondary | ICD-10-CM | POA: Diagnosis not present

## 2021-09-21 DIAGNOSIS — E2839 Other primary ovarian failure: Secondary | ICD-10-CM

## 2021-09-21 DIAGNOSIS — M85859 Other specified disorders of bone density and structure, unspecified thigh: Secondary | ICD-10-CM

## 2021-09-21 DIAGNOSIS — Z780 Asymptomatic menopausal state: Secondary | ICD-10-CM | POA: Diagnosis not present

## 2021-09-21 NOTE — Telephone Encounter (Signed)
Separate order signed. Unable to sign one in Epic

## 2021-09-25 ENCOUNTER — Other Ambulatory Visit: Payer: Medicare Other

## 2021-09-25 ENCOUNTER — Ambulatory Visit: Payer: Medicare Other

## 2021-10-09 ENCOUNTER — Ambulatory Visit
Admission: RE | Admit: 2021-10-09 | Discharge: 2021-10-09 | Disposition: A | Discharge status: Home / Self Care | Payer: Medicare Other | Source: Ambulatory Visit | Attending: Family Medicine | Admitting: Family Medicine

## 2021-10-09 DIAGNOSIS — Z1231 Encounter for screening mammogram for malignant neoplasm of breast: Secondary | ICD-10-CM

## 2021-11-06 ENCOUNTER — Other Ambulatory Visit: Payer: Self-pay

## 2021-11-06 MED ORDER — ESTRADIOL 0.1 MG/GM VA CREA: TOPICAL_CREAM | VAGINAL | 0 refills | Status: DC

## 2021-11-08 ENCOUNTER — Other Ambulatory Visit: Payer: Self-pay

## 2021-11-08 MED ORDER — COVID-19 MRNA 2023-2024 VACCINE (COMIRNATY) 0.3 ML INJECTION
0.3000 mL | Freq: Once | INTRAMUSCULAR | 0 refills | Status: AC
  Filled 2021-11-09: qty 0.3, 1d supply, fill #0

## 2021-11-09 ENCOUNTER — Other Ambulatory Visit: Payer: Self-pay

## 2021-11-10 DIAGNOSIS — Z23 Encounter for immunization: Secondary | ICD-10-CM | POA: Diagnosis not present

## 2021-11-11 IMAGING — MG MM DIGITAL SCREENING BILAT W/ TOMO AND CAD
8 series · 9 of 24 positions shown · non-contrast
Comparison: Previous exam(s).

CLINICAL DATA: Screening.

EXAM:
DIGITAL SCREENING BILATERAL MAMMOGRAM WITH TOMOSYNTHESIS AND CAD
TECHNIQUE: Bilateral screening digital craniocaudal and mediolateral oblique
mammograms were obtained. Bilateral screening digital breast
tomosynthesis was performed. The images were evaluated with
computer-aided detection.

[L CC synth-2D]
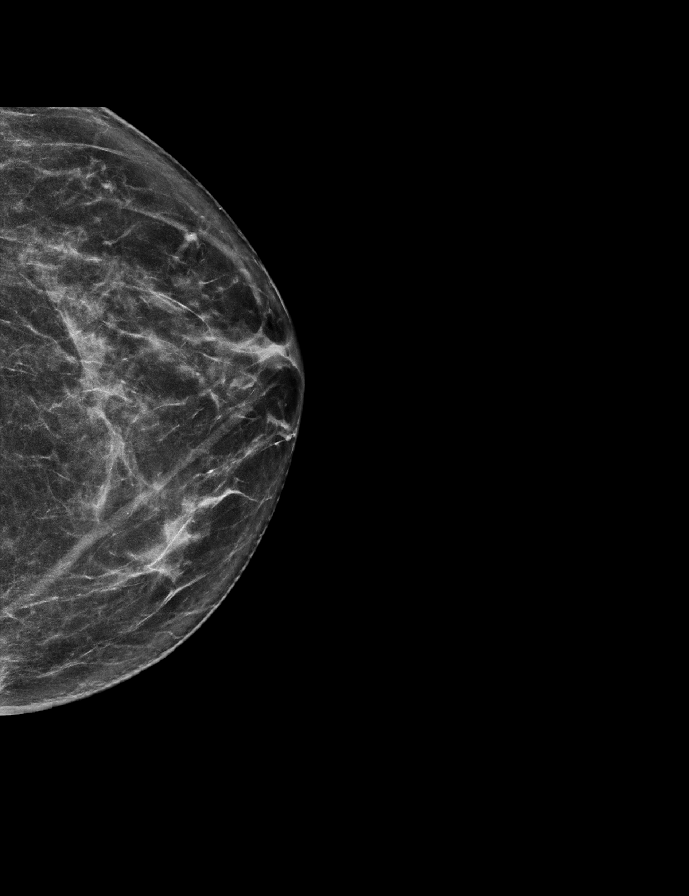

[R CC synth-2D]
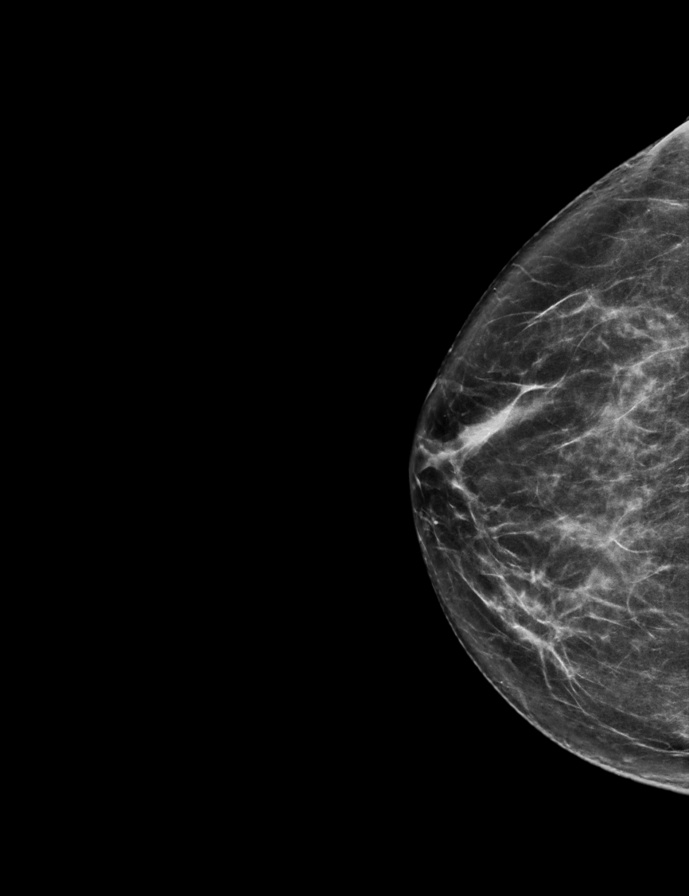

[R MLO synth-2D]
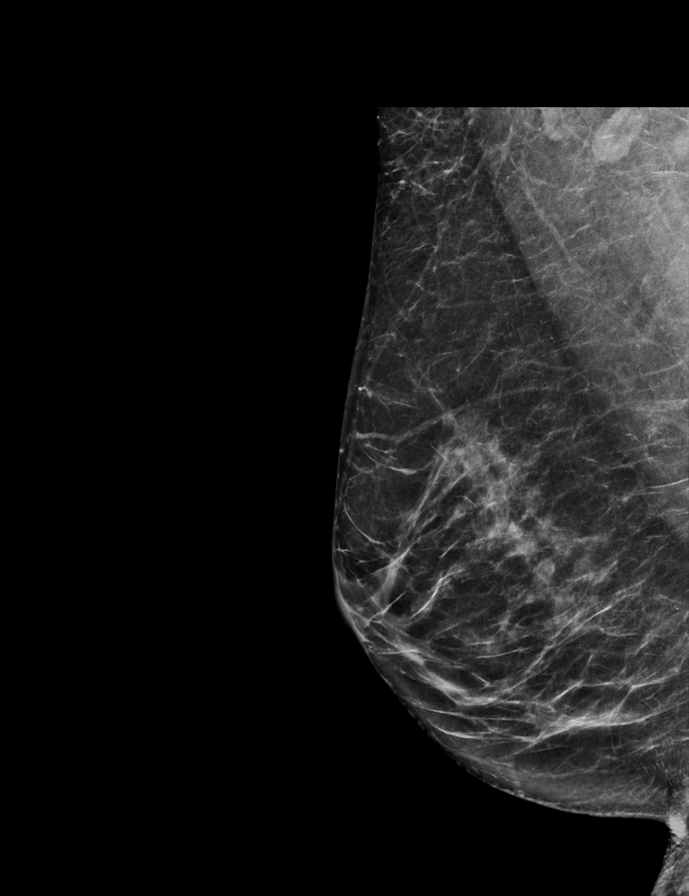

[L MLO synth-2D]
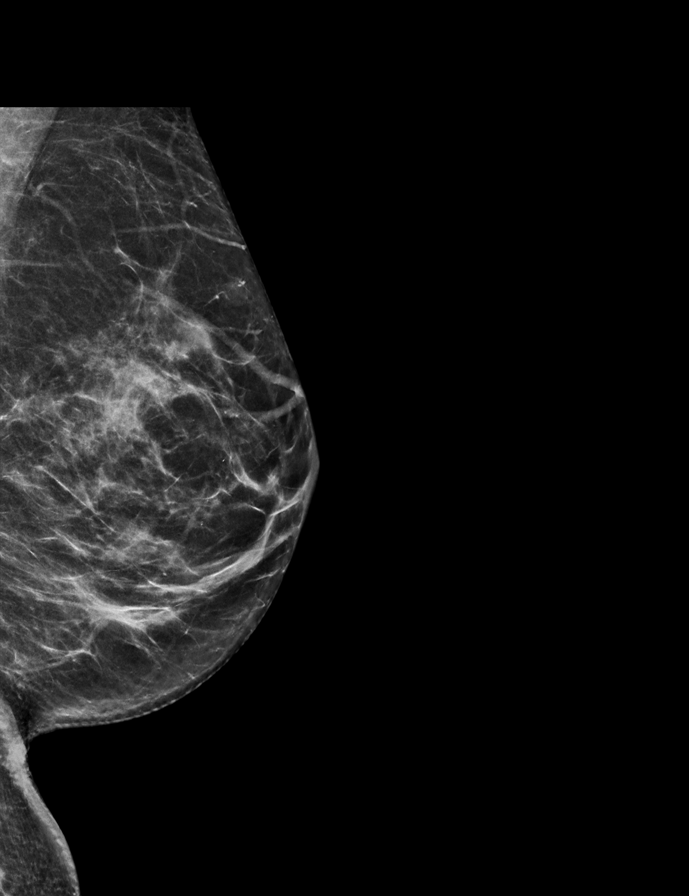

[R MLO tomo · 2 of 66 frames shown]
[frame 22/66]
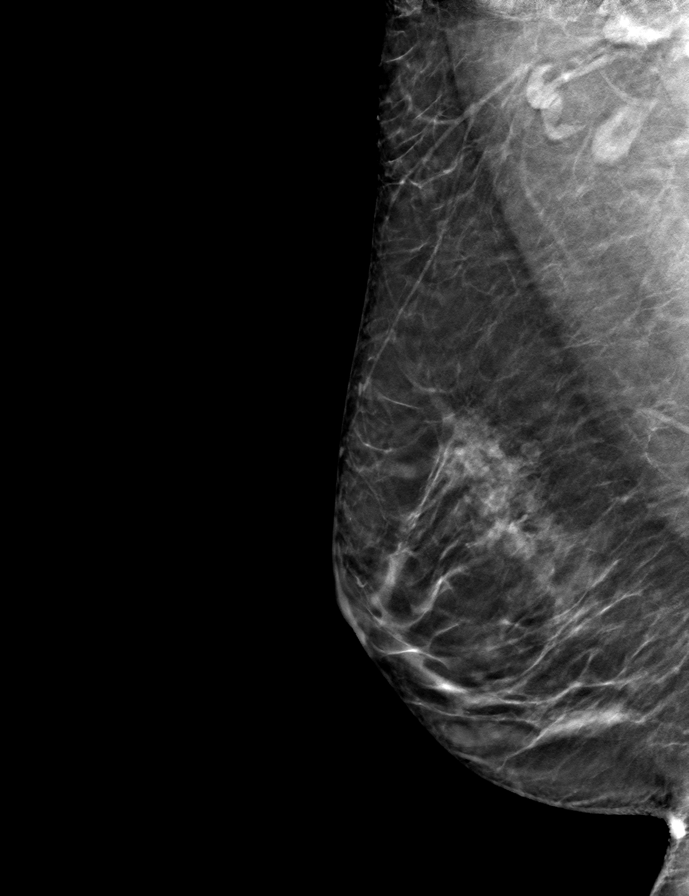
[frame 33/66]
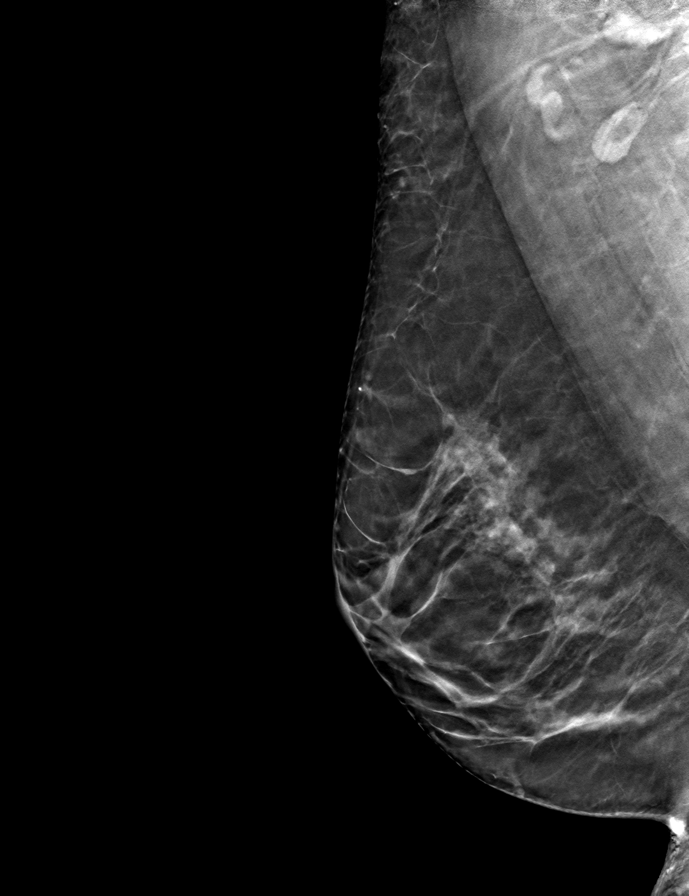

[L MLO tomo · tomo slice 30/59.0]
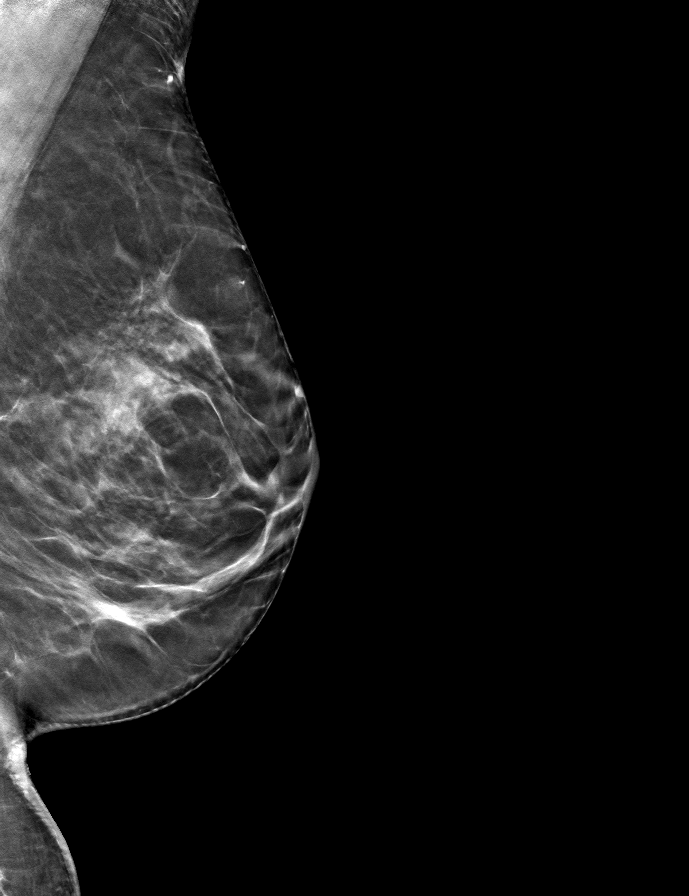

[R CC tomo · tomo slice 31/62.0]
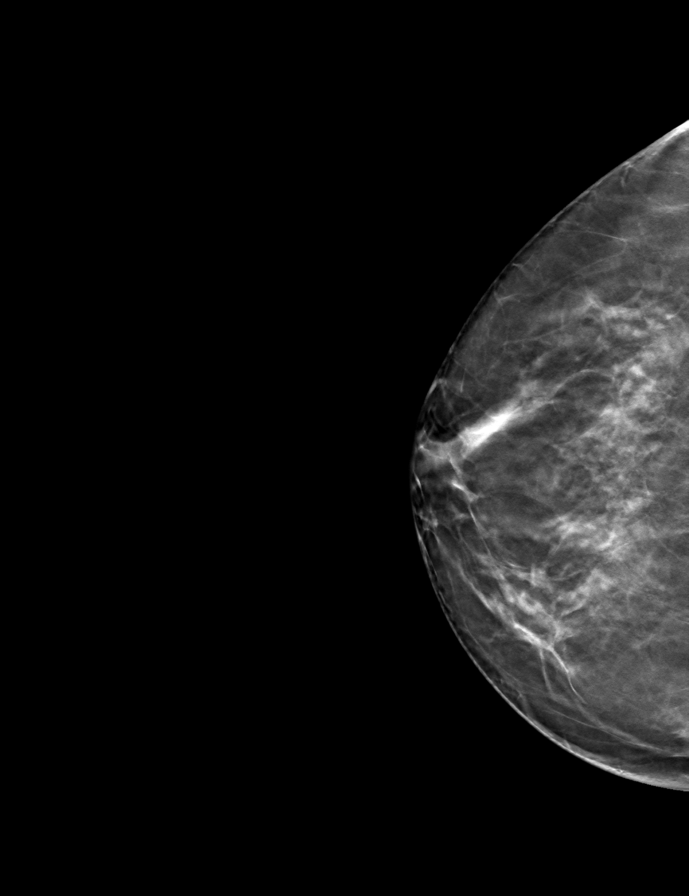

[L CC tomo · tomo slice 32/63.0]
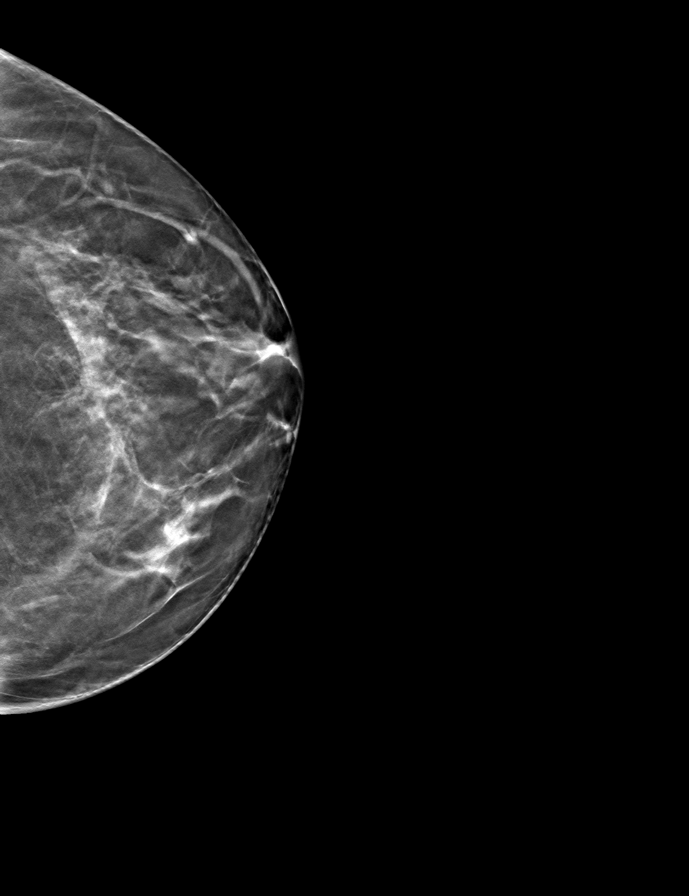

[9 of 24 positions shown; findings below may reference images not displayed]

ACR Breast Density Category c: The breast tissue is heterogeneously
dense, which may obscure small masses.
FINDINGS: There are no findings suspicious for malignancy.
IMPRESSION: No mammographic evidence of malignancy. A result letter of this
screening mammogram will be mailed directly to the patient.

RECOMMENDATION:
Screening mammogram in one year. (Code:Q3-W-BC3)

BI-RADS CATEGORY  1: Negative.

## 2021-11-13 ENCOUNTER — Encounter: Payer: Medicare Other | Admitting: Family Medicine

## 2021-11-16 ENCOUNTER — Encounter: Payer: Medicare Other | Admitting: Family Medicine

## 2021-11-20 ENCOUNTER — Ambulatory Visit (INDEPENDENT_AMBULATORY_CARE_PROVIDER_SITE_OTHER): Payer: Medicare Other | Admitting: Internal Medicine

## 2021-11-20 ENCOUNTER — Encounter: Payer: Self-pay | Admitting: Internal Medicine

## 2021-11-20 VITALS — BP 122/76 | HR 60 | Temp 97.2°F | Ht 68.0 in | Wt 157.0 lb

## 2021-11-20 DIAGNOSIS — Z0000 Encounter for general adult medical examination without abnormal findings: Secondary | ICD-10-CM | POA: Diagnosis not present

## 2021-11-20 DIAGNOSIS — I10 Essential (primary) hypertension: Secondary | ICD-10-CM | POA: Diagnosis not present

## 2021-11-20 DIAGNOSIS — K2100 Gastro-esophageal reflux disease with esophagitis, without bleeding: Secondary | ICD-10-CM

## 2021-11-20 DIAGNOSIS — N952 Postmenopausal atrophic vaginitis: Secondary | ICD-10-CM | POA: Diagnosis not present

## 2021-11-20 DIAGNOSIS — F39 Unspecified mood [affective] disorder: Secondary | ICD-10-CM | POA: Diagnosis not present

## 2021-11-20 NOTE — Assessment & Plan Note (Signed)
BP Readings from Last 3 Encounters:  11/20/21 122/76  07/21/21 126/84  07/20/21 102/66   Has been doing fine without meds

## 2021-11-20 NOTE — Assessment & Plan Note (Signed)
I have personally reviewed the Medicare Annual Wellness questionnaire and have noted 1. The patient's medical and social history 2. Their use of alcohol, tobacco or illicit drugs 3. Their current medications and supplements 4. The patient's functional ability including ADL's, fall risks, home safety risks and hearing or visual             impairment. 5. Diet and physical activities 6. Evidence for depression or mood disorders  The patients weight, height, BMI and visual acuity have been recorded in the chart I have made referrals, counseling and provided education to the patient based review of the above and I have provided the pt with a written personalized care plan for preventive services.  I have provided you with a copy of your personalized plan for preventive services. Please take the time to review along with your updated medication list.  Exercises regularly Had flu vaccine and updated COVID recently Yearly mammogram till at least 76 Colon due next year

## 2021-11-20 NOTE — Assessment & Plan Note (Signed)
Symptoms much better with the vaginal estrogen twice a week

## 2021-11-20 NOTE — Assessment & Plan Note (Signed)
Has had a surge of epigastric pain after eating (?esophageal) Some better now---will use the omeprazole 20 bid till symptoms gone then go back to daily If symptoms persist, will need to go to GI

## 2021-11-20 NOTE — Assessment & Plan Note (Signed)
Mild episodic anxiety Reactive depression is now better Rare lorazepam now

## 2021-11-20 NOTE — Progress Notes (Signed)
Subjective:    Patient ID: Lisa Medina, female    DOB: 04-27-1950, 71 y.o.   MRN: [id number]  HPI Here for transfer of care and Medicare wellness visit Reviewed advanced directives Reviewed other doctors--Dr Sharyl Nimrod, Dr Collier Salina, Dr Perlie Mayo, [facility] derm Cholecystectomy in June--no other surgery or hospitalizations this year Vision is good Hearing is okay Occasional wine No tobacco Regular exercise--3 times per week swimming/weights No falls Independent with instrumental ADLs No memory loss  Has been careful with eating since the gallbladder taken out But the food seems to "land right there" by stomach---cramping, ?gas Had been almost every meal--but now better in the past week May have some diarrhea after eating Did double up on omeprazole--it did help (but has been inconsistent) No dysphagia  Still using the vaginal cream twice a week No recent urinary problems No itching now, etc  No sex  Sleeping better lately--hasn't needed it lately Still gets some anxiety--has the lorazepam for prn use (uses librium for years in the past) No depression now--did have some trouble about a year ago Saw therapist for a year--but done now Brother had been sick, still adjusting to retirement/losing mom, etc Didn't tolerate antidepressants in the past Not anhedonic  Current Outpatient Medications on File Prior to Visit  Medication Sig Dispense Refill   Ca Phosphate-Cholecalciferol (CALCIUM 500 + D3) 250-500 MG-UNIT CHEW Chew 1 tablet by mouth as needed.     estradiol (ESTRACE) 0.1 MG/GM vaginal cream USE PEA SIZED AMOUNT VAGINALLY TO VULVA 2 TIMES A WEEK 42.5 g 0   ibuprofen (ADVIL) 800 MG tablet Take 1 tablet (800 mg total) by mouth every 8 (eight) hours as needed. 30 tablet 0   LORazepam (ATIVAN) 0.5 MG tablet Take 0.5-1 tablets (0.25-0.5 mg total) by mouth daily as needed for anxiety. 30 tablet 0   omeprazole (PRILOSEC OTC) 20 MG tablet Take 20 mg by  mouth daily.     traZODone (DESYREL) 50 MG tablet Take 0.5-1 tablets (25-50 mg total) by mouth at bedtime as needed for sleep. 30 tablet 3   ondansetron (ZOFRAN) 4 MG tablet Take 1 tablet (4 mg total) by mouth every 8 (eight) hours as needed for nausea or vomiting. (Patient not taking: Reported on 11/20/2021) 10 tablet 0   No current facility-administered medications on file prior to visit.    Allergies  Allergen Reactions   Macrobid [Nitrofurantoin] Swelling    Past Medical History:  Diagnosis Date   Anemia    borderline   Diverticulitis 06/2021   GERD (gastroesophageal reflux disease)    Hypertension    off meds x 8 years now-bp controlled   Leaky heart valve    Urinary tract bacterial infections     Past Surgical History:  Procedure Laterality Date   CHOLECYSTECTOMY     COLONOSCOPY  2003, 2008   UPPER GI ENDOSCOPY      Family History  Problem Relation Age of Onset   Hyperlipidemia Mother    Stroke Mother 69   Hypertension Mother    Heart disease Father    Hypertension Father    Hyperlipidemia Brother    Hypertension Brother    Hypertension Maternal Grandmother    Hypertension Maternal Grandfather    Hypertension Paternal Grandmother    Hypertension Paternal Grandfather    Colon cancer Neg Hx    Esophageal cancer Neg Hx    Rectal cancer Neg Hx    Stomach cancer Neg Hx    Breast cancer Neg Hx  Social History   Socioeconomic History   Marital status: Legally Separated    Spouse name: Harless Litten   Number of children: 0   Years of education: some college   Highest education level: Not on file  Occupational History   Occupation: Engineer, site    Comment: Retired   Occupation: Airline pilot and use tax consulting    Comment: Part time  Tobacco Use   Smoking status: Former    Packs/day: 0.25    Years: 15.00    Total pack years: 3.75    Types: Cigarettes    Quit date: 01/16/1992    Years since quitting: 29.8    Passive  exposure: Never   Smokeless tobacco: Never  Vaping Use   Vaping Use: Never used  Substance and Sexual Activity   Alcohol use: Yes    Alcohol/week: 2.0 standard drinks of alcohol    Types: 2 Glasses of wine per week    Comment: 1-2 times weekly   Drug use: No   Sexual activity: Yes    Birth control/protection: Post-menopausal  Other Topics Concern   Not on file  Social History Narrative   Separated   Full code- desires CPR but does not want prolonged life support if futile.   09/14/19   From: [place]    Work: retired, works part-time - Airline pilot and use taxes      Family: brother in [place] and brother in [place]      Enjoys: exercise      Exercise: working out 3 times a week- [employer] and swimming   Diet: fair - breakfast, lunch, and light dinner      Safety   Seat belts: Yes    Guns: No   Safe in relationships: Yes    Social Determinants of Corporate investment banker Strain: Not on file  Food Insecurity: Not on file  Transportation Needs: Not on file  Physical Activity: Not on file  Stress: Not on file  Social Connections: Not on file  Intimate Partner Violence: Not on file   Review of Systems Appetite is okay Weight is stable Generally sleeps okay Wears seat belt Teeth are good----keeps up with dentist No suspicious skin lesions Bowels move okay---no blood No recent urinary problems Some back pain and mild arthralgias (relates to her work outs). No meds No chest pain or SOB No dizziness or syncope No edema    Objective:   Physical Exam Constitutional:      Appearance: Normal appearance.  HENT:     Mouth/Throat:     Comments: No lesions Eyes:     Conjunctiva/sclera: Conjunctivae normal.     Pupils: Pupils are equal, round, and reactive to light.  Cardiovascular:     Rate and Rhythm: Normal rate and regular rhythm.     Pulses: Normal pulses.     Heart sounds: No murmur heard.    No gallop.  Pulmonary:     Effort: Pulmonary effort is normal.      Breath sounds: Normal breath sounds. No wheezing or rales.  Abdominal:     Palpations: Abdomen is soft.     Tenderness: There is no abdominal tenderness.  Musculoskeletal:     Cervical back: Neck supple.     Right lower leg: No edema.     Left lower leg: No edema.  Lymphadenopathy:     Cervical: No cervical adenopathy.  Skin:    Findings: No lesion or rash.  Neurological:     General: No  focal deficit present.     Mental Status: She is alert and oriented to person, place, and time.  Psychiatric:        Mood and Affect: Mood normal.        Behavior: Behavior normal.            Assessment & Plan:

## 2021-11-20 NOTE — Progress Notes (Signed)
Hearing Screening - Comments:: Passed whisper test Vision Screening - Comments:: December 2022

## 2021-12-22 DIAGNOSIS — H04123 Dry eye syndrome of bilateral lacrimal glands: Secondary | ICD-10-CM | POA: Diagnosis not present

## 2021-12-22 DIAGNOSIS — H5213 Myopia, bilateral: Secondary | ICD-10-CM | POA: Diagnosis not present

## 2021-12-22 DIAGNOSIS — H524 Presbyopia: Secondary | ICD-10-CM | POA: Diagnosis not present

## 2021-12-22 DIAGNOSIS — H52223 Regular astigmatism, bilateral: Secondary | ICD-10-CM | POA: Diagnosis not present

## 2022-03-07 ENCOUNTER — Other Ambulatory Visit: Payer: Self-pay | Admitting: Family

## 2022-03-12 ENCOUNTER — Telehealth: Payer: Self-pay | Admitting: Internal Medicine

## 2022-03-12 MED ORDER — ESTRADIOL 0.1 MG/GM VA CREA: TOPICAL_CREAM | VAGINAL | 1 refills | Status: DC

## 2022-03-12 NOTE — Telephone Encounter (Signed)
Prescription Request  03/12/2022  Is this a "Controlled Substance" medicine? No  LOV: 11/20/2021  What is the name of the medication or equipment? estradiol (ESTRACE) 0.1 MG/GM vaginal cream   Have you contacted your pharmacy to request a refill? No   Which pharmacy would you like this sent to?  [facility] [place], [place] [address] [place] [address] Phone: [phone] Fax: [phone]     Patient notified that their request is being sent to the clinical staff for review and that they should receive a response within 2 business days.   Please advise at Mobile [phone] (mobile)

## 2022-03-12 NOTE — Telephone Encounter (Signed)
Rx sent electronically.  

## 2022-03-12 NOTE — Addendum Note (Signed)
Addended by: Pilar Grammes on: 03/12/2022 04:25 PM   Modules accepted: Orders

## 2022-08-27 ENCOUNTER — Telehealth: Payer: Self-pay | Admitting: Internal Medicine

## 2022-08-27 NOTE — Telephone Encounter (Signed)
Patient contacted the office requesting that dr Alphonsus Sias place a order for her to have her mammogram done at the [facility] in . Patient will be due after 9/25.please advise patient if needed.

## 2022-08-28 ENCOUNTER — Other Ambulatory Visit: Payer: Self-pay | Admitting: Internal Medicine

## 2022-08-28 DIAGNOSIS — Z1231 Encounter for screening mammogram for malignant neoplasm of breast: Secondary | ICD-10-CM

## 2022-08-28 NOTE — Telephone Encounter (Signed)
Called and spoke to pt. Advised her that unless she was having an issue that required a diagnostic mammogram, she should be abel to make her own appt at [facility]. She is changing from [facility] to something more local. I also told her about the mobile unit her at [facility] several times a year.

## 2022-09-26 DIAGNOSIS — D2271 Melanocytic nevi of right lower limb, including hip: Secondary | ICD-10-CM | POA: Diagnosis not present

## 2022-09-26 DIAGNOSIS — D225 Melanocytic nevi of trunk: Secondary | ICD-10-CM | POA: Diagnosis not present

## 2022-09-26 DIAGNOSIS — D2272 Melanocytic nevi of left lower limb, including hip: Secondary | ICD-10-CM | POA: Diagnosis not present

## 2022-09-26 DIAGNOSIS — D492 Neoplasm of unspecified behavior of bone, soft tissue, and skin: Secondary | ICD-10-CM | POA: Diagnosis not present

## 2022-09-26 DIAGNOSIS — C4362 Malignant melanoma of left upper limb, including shoulder: Secondary | ICD-10-CM | POA: Diagnosis not present

## 2022-09-26 DIAGNOSIS — D2262 Melanocytic nevi of left upper limb, including shoulder: Secondary | ICD-10-CM | POA: Diagnosis not present

## 2022-09-26 DIAGNOSIS — D2261 Melanocytic nevi of right upper limb, including shoulder: Secondary | ICD-10-CM | POA: Diagnosis not present

## 2022-09-26 DIAGNOSIS — D22122 Melanocytic nevi of left lower eyelid, including canthus: Secondary | ICD-10-CM | POA: Diagnosis not present

## 2022-09-26 DIAGNOSIS — C4371 Malignant melanoma of right lower limb, including hip: Secondary | ICD-10-CM | POA: Diagnosis not present

## 2022-09-26 DIAGNOSIS — D485 Neoplasm of uncertain behavior of skin: Secondary | ICD-10-CM | POA: Diagnosis not present

## 2022-09-26 DIAGNOSIS — L821 Other seborrheic keratosis: Secondary | ICD-10-CM | POA: Diagnosis not present

## 2022-10-06 ENCOUNTER — Encounter: Payer: Self-pay | Admitting: Internal Medicine

## 2022-10-08 DIAGNOSIS — D0362 Melanoma in situ of left upper limb, including shoulder: Secondary | ICD-10-CM | POA: Diagnosis not present

## 2022-10-08 DIAGNOSIS — C4359 Malignant melanoma of other part of trunk: Secondary | ICD-10-CM | POA: Diagnosis not present

## 2022-10-11 ENCOUNTER — Ambulatory Visit
Admission: RE | Admit: 2022-10-11 | Discharge: 2022-10-11 | Disposition: A | Discharge status: Home / Self Care | Payer: Medicare Other | Source: Ambulatory Visit | Attending: Internal Medicine | Admitting: Internal Medicine

## 2022-10-11 DIAGNOSIS — Z1231 Encounter for screening mammogram for malignant neoplasm of breast: Secondary | ICD-10-CM | POA: Diagnosis present

## 2022-10-22 DIAGNOSIS — L988 Other specified disorders of the skin and subcutaneous tissue: Secondary | ICD-10-CM | POA: Diagnosis not present

## 2022-10-22 DIAGNOSIS — L578 Other skin changes due to chronic exposure to nonionizing radiation: Secondary | ICD-10-CM | POA: Diagnosis not present

## 2022-10-22 DIAGNOSIS — C4371 Malignant melanoma of right lower limb, including hip: Secondary | ICD-10-CM | POA: Diagnosis not present

## 2022-10-23 ENCOUNTER — Encounter: Payer: Self-pay | Admitting: Internal Medicine

## 2022-10-23 HISTORY — PX: MOHS SURGERY: SUR867

## 2022-11-02 ENCOUNTER — Other Ambulatory Visit: Payer: Self-pay | Admitting: Internal Medicine

## 2022-11-06 DIAGNOSIS — Z23 Encounter for immunization: Secondary | ICD-10-CM | POA: Diagnosis not present

## 2022-11-22 DIAGNOSIS — Z23 Encounter for immunization: Secondary | ICD-10-CM | POA: Diagnosis not present

## 2022-11-26 ENCOUNTER — Encounter: Payer: Self-pay | Admitting: Internal Medicine

## 2022-11-26 ENCOUNTER — Ambulatory Visit (INDEPENDENT_AMBULATORY_CARE_PROVIDER_SITE_OTHER): Payer: Medicare Other | Admitting: Internal Medicine

## 2022-11-26 VITALS — BP 110/76 | HR 68 | Temp 98.7°F | Ht 68.25 in | Wt 162.0 lb

## 2022-11-26 DIAGNOSIS — N1831 Chronic kidney disease, stage 3a: Secondary | ICD-10-CM

## 2022-11-26 DIAGNOSIS — Z0000 Encounter for general adult medical examination without abnormal findings: Secondary | ICD-10-CM | POA: Diagnosis not present

## 2022-11-26 DIAGNOSIS — N952 Postmenopausal atrophic vaginitis: Secondary | ICD-10-CM | POA: Diagnosis not present

## 2022-11-26 DIAGNOSIS — K219 Gastro-esophageal reflux disease without esophagitis: Secondary | ICD-10-CM

## 2022-11-26 DIAGNOSIS — F39 Unspecified mood [affective] disorder: Secondary | ICD-10-CM

## 2022-11-26 DIAGNOSIS — I10 Essential (primary) hypertension: Secondary | ICD-10-CM

## 2022-11-26 NOTE — Progress Notes (Signed)
Hearing Screening - Comments:: Passed whisper test Vision Screening - Comments:: July 2024

## 2022-11-26 NOTE — Assessment & Plan Note (Signed)
Is on the topical estrogen twice a week

## 2022-11-26 NOTE — Assessment & Plan Note (Signed)
Has been okay without meds lately  BP Readings from Last 3 Encounters:  11/26/22 110/76  11/20/21 122/76  07/21/21 126/84

## 2022-11-26 NOTE — Assessment & Plan Note (Signed)
Mostly quiet on nexium 20mg  daily

## 2022-11-26 NOTE — Assessment & Plan Note (Signed)
Paxil in the past but better Has lorazepam for prn --but hasn't needed lately Still adjusting to retirement--finding purpose  No meds

## 2022-11-26 NOTE — Progress Notes (Signed)
Subjective:    Patient ID: Lisa Medina, female    DOB: Jun 22, 1950, 72 y.o.   MRN: [id number]  HPI Here for Medicare wellness visit and follow up of chronic health conditions Reviewed advanced directives Reviewed other doctors--Dr Borg--derm, Dr Larene Pickett surgeon, Dr Collier [place], Dr Mitchum--dentist Only surgery was Moh's surgery on right foot for melanoma. Does do weights Limited exercise since foot surgery---will resume swimming when possible (and walking) Occasional glass of wine Vision is fine Hearing is good No tobacco No falls No depression but has had some degree of anhedonia (still adjusting to retirement) Independent with instrumental ADLs No sig memory issues  Reviewed labs Had last GFR 59  Started taking align--along with nexium Feels her digestive issues have improved (that gallbaldder surgery messed up) Bowels regular Heartburn is controlled--unless she misses med (or eats late) No dysphagia  Went to derm---had melanoma in 2 spots Had wider excisions No further testing was needed  Did have Rx for HTN in the past--but not recently Improved when she stopped the estrogen Is on topical estrogen (for UTIs and dryness) No chest pain or SOB No dizziness or syncope No edema No palpitations  Had lorazepam for anxiety Hasn't taken it in a long time--no refills this year  Current Outpatient Medications on File Prior to Visit  Medication Sig Dispense Refill   Ca Phosphate-Cholecalciferol (CALCIUM 500 + D3) 250-500 MG-UNIT CHEW Chew 1 tablet by mouth as needed.     esomeprazole (NEXIUM) 20 MG capsule Take 20 mg by mouth daily.     estradiol (ESTRACE) 0.1 MG/GM vaginal cream USE PEA SIZED AMOUNT VAGINALLY TO VULVA 2 TIMES A WEEK 42.5 g 1   ibuprofen (ADVIL) 800 MG tablet Take 1 tablet (800 mg total) by mouth every 8 (eight) hours as needed. 30 tablet 0   LORazepam (ATIVAN) 0.5 MG tablet Take 0.5-1 tablets (0.25-0.5 mg total) by mouth daily as needed for  anxiety. 30 tablet 0   ondansetron (ZOFRAN) 4 MG tablet Take 1 tablet (4 mg total) by mouth every 8 (eight) hours as needed for nausea or vomiting. 10 tablet 0   Probiotic Product (ALIGN PO) Take by mouth.     traZODone (DESYREL) 50 MG tablet Take 0.5-1 tablets (25-50 mg total) by mouth at bedtime as needed for sleep. 30 tablet 3   No current facility-administered medications on file prior to visit.    Allergies  Allergen Reactions   Macrobid [Nitrofurantoin] Swelling    Past Medical History:  Diagnosis Date   Anemia    borderline   Diverticulitis 06/2021   GERD (gastroesophageal reflux disease)    Hypertension    off meds x 8 years now-bp controlled   Leaky heart valve    Urinary tract bacterial infections     Past Surgical History:  Procedure Laterality Date   CHOLECYSTECTOMY     COLONOSCOPY  2003, 2008   MOHS SURGERY  10/23/2022   melanoma   UPPER GI ENDOSCOPY      Family History  Problem Relation Age of Onset   Hyperlipidemia Mother    Stroke Mother 61   Hypertension Mother    Heart disease Father    Hypertension Father    Hyperlipidemia Brother    Hypertension Brother    Hypertension Maternal Grandmother    Hypertension Maternal Grandfather    Hypertension Paternal Grandmother    Hypertension Paternal Grandfather    Colon cancer Neg Hx    Esophageal cancer Neg Hx    Rectal cancer Neg  Hx    Stomach cancer Neg Hx    Breast cancer Neg Hx     Social History   Socioeconomic History   Marital status: Legally Separated    Spouse name: Harless Litten   Number of children: 0   Years of education: some college   Highest education level: Not on file  Occupational History   Occupation: Engineer, site    Comment: Retired   Occupation: Airline pilot and use tax consulting    Comment: Part time  Tobacco Use   Smoking status: Former    Current packs/day: 0.00    Average packs/day: 0.3 packs/day for 15.0 years (3.8 ttl pk-yrs)     Types: Cigarettes    Start date: 01/15/1977    Quit date: 01/16/1992    Years since quitting: 30.8    Passive exposure: Never   Smokeless tobacco: Never  Vaping Use   Vaping status: Never Used  Substance and Sexual Activity   Alcohol use: Yes    Alcohol/week: 2.0 standard drinks of alcohol    Types: 2 Glasses of wine per week    Comment: 1-2 times weekly   Drug use: No   Sexual activity: Yes    Birth control/protection: Post-menopausal  Other Topics Concern   Not on file  Social History Narrative   Separated   Husband would still be health care POA   Full code- desires CPR but does not want prolonged life support if futile.      09/14/19   From: [place]    Work: retired, works part-time - Airline pilot and use taxes      Family: brother in [place] and brother in [place]      Enjoys: exercise      Exercise: working out 3 times a week- weights and swimming   Diet: fair - breakfast, lunch, and light dinner      Safety   Seat belts: Yes    Guns: No   Safe in relationships: Yes    Social Determinants of Corporate investment banker Strain: Not on file  Food Insecurity: Not on file  Transportation Needs: Not on file  Physical Activity: Not on file  Stress: Not on file  Social Connections: Not on file  Intimate Partner Violence: Not on file   Review of Systems Appetite is good Weight is stable Sleeps okay Wears seat belt Teeth are fine--keeps up with dentist No urinary problems Gets some low back pain on the right---?sciatica. Does ease up with walking    Objective:   Physical Exam Constitutional:      Appearance: Normal appearance.  HENT:     Mouth/Throat:     Pharynx: No oropharyngeal exudate or posterior oropharyngeal erythema.  Eyes:     Conjunctiva/sclera: Conjunctivae normal.     Pupils: Pupils are equal, round, and reactive to light.  Cardiovascular:     Rate and Rhythm: Normal rate and regular rhythm.     Pulses: Normal pulses.     Heart sounds: No  murmur heard.    No gallop.  Pulmonary:     Effort: Pulmonary effort is normal.     Breath sounds: Normal breath sounds. No wheezing or rales.  Abdominal:     Palpations: Abdomen is soft.     Tenderness: There is no abdominal tenderness.  Musculoskeletal:     Cervical back: Neck supple.     Right lower leg: No edema.     Left lower leg: No edema.  Lymphadenopathy:  Cervical: No cervical adenopathy.  Skin:    Findings: No rash.  Neurological:     General: No focal deficit present.     Mental Status: She is alert and oriented to person, place, and time.     Comments: Word naming--15/1 minute Recall --- 2/3  Psychiatric:        Mood and Affect: Mood normal.        Behavior: Behavior normal.            Assessment & Plan:

## 2022-11-26 NOTE — Assessment & Plan Note (Signed)
I have personally reviewed the Medicare Annual Wellness questionnaire and have noted 1. The patient's medical and social history 2. Their use of alcohol, tobacco or illicit drugs 3. Their current medications and supplements 4. The patient's functional ability including ADL's, fall risks, home safety risks and hearing or visual             impairment. 5. Diet and physical activities 6. Evidence for depression or mood disorders  The patients weight, height, BMI and visual acuity have been recorded in the chart I have made referrals, counseling and provided education to the patient based review of the above and I have provided the pt with a written personalized care plan for preventive services.  I have provided you with a copy of your personalized plan for preventive services. Please take the time to review along with your updated medication list.  Does exercise regularly--getting back to this since surgery on foot Due for colonoscopy now--has to call back Mammogram yearly till at least 76--probably 81 Had flu/COVID vaccines and UTD on others

## 2022-11-26 NOTE — Assessment & Plan Note (Signed)
Borderline Will recheck 

## 2022-11-27 LAB — LIPID PANEL
Cholesterol: 198 mg/dL (ref 0–200)
HDL: 61 mg/dL (ref >39.00)
LDL Cholesterol: 120 mg/dL — ABNORMAL HIGH (ref 0–99)
NonHDL: 137.33
Total CHOL/HDL Ratio: 3
Triglycerides: 85 mg/dL (ref 0.0–149.0)
VLDL: 17 mg/dL (ref 0.0–40.0)

## 2022-11-27 LAB — CBC
HCT: 40.6 % (ref 36.0–46.0)
Hemoglobin: 13.7 g/dL (ref 12.0–15.0)
MCHC: 33.6 g/dL (ref 30.0–36.0)
MCV: 92 fL (ref 78.0–100.0)
Platelets: 252 10*3/uL (ref 150.0–400.0)
RBC: 4.42 Mil/uL (ref 3.87–5.11)
RDW: 13 % (ref 11.5–15.5)
WBC: 5.9 10*3/uL (ref 4.0–10.5)

## 2022-11-27 LAB — RENAL FUNCTION PANEL
Albumin: 4.2 g/dL (ref 3.5–5.2)
BUN: 15 mg/dL (ref 6–23)
CO2: 29 meq/L (ref 19–32)
Calcium: 9.4 mg/dL (ref 8.4–10.5)
Chloride: 103 meq/L (ref 96–112)
Creatinine, Ser: 1.01 mg/dL (ref 0.40–1.20)
GFR: 56.16 mL/min — ABNORMAL LOW (ref >60.00)
Glucose, Bld: 88 mg/dL (ref 70–99)
Phosphorus: 3.8 mg/dL (ref 2.3–4.6)
Potassium: 4.3 meq/L (ref 3.5–5.1)
Sodium: 139 meq/L (ref 135–145)

## 2022-11-27 LAB — HEPATIC FUNCTION PANEL
ALT: 14 U/L (ref 0–35)
AST: 18 U/L (ref 0–37)
Albumin: 4.2 g/dL (ref 3.5–5.2)
Alkaline Phosphatase: 76 U/L (ref 39–117)
Bilirubin, Direct: 0.1 mg/dL (ref 0.0–0.3)
Total Bilirubin: 0.5 mg/dL (ref 0.2–1.2)
Total Protein: 6.9 g/dL (ref 6.0–8.3)

## 2022-12-11 DIAGNOSIS — Z85828 Personal history of other malignant neoplasm of skin: Secondary | ICD-10-CM | POA: Diagnosis not present

## 2022-12-31 DIAGNOSIS — Z85828 Personal history of other malignant neoplasm of skin: Secondary | ICD-10-CM | POA: Diagnosis not present

## 2023-01-25 DIAGNOSIS — H524 Presbyopia: Secondary | ICD-10-CM | POA: Diagnosis not present

## 2023-01-25 DIAGNOSIS — H16223 Keratoconjunctivitis sicca, not specified as Sjogren's, bilateral: Secondary | ICD-10-CM | POA: Diagnosis not present

## 2023-01-25 DIAGNOSIS — H5213 Myopia, bilateral: Secondary | ICD-10-CM | POA: Diagnosis not present

## 2023-01-25 DIAGNOSIS — H52223 Regular astigmatism, bilateral: Secondary | ICD-10-CM | POA: Diagnosis not present

## 2023-01-25 DIAGNOSIS — H04123 Dry eye syndrome of bilateral lacrimal glands: Secondary | ICD-10-CM | POA: Diagnosis not present

## 2023-01-29 ENCOUNTER — Ambulatory Visit (AMBULATORY_SURGERY_CENTER): Payer: Medicare Other

## 2023-01-29 VITALS — Ht 68.25 in | Wt 160.0 lb

## 2023-01-29 DIAGNOSIS — Z1211 Encounter for screening for malignant neoplasm of colon: Secondary | ICD-10-CM

## 2023-01-29 MED ORDER — NA SULFATE-K SULFATE-MG SULF 17.5-3.13-1.6 GM/177ML PO SOLN: 1.0000 | Freq: Once | ORAL | 0 refills | Status: AC

## 2023-01-29 NOTE — Progress Notes (Signed)
No egg or soy allergy known to patient  No issues known to pt with past sedation with any surgeries or procedures Patient denies ever being told they had issues or difficulty with intubation  No FH of Malignant Hyperthermia Pt is not on diet pills Pt is not on  home 02  Pt is not on blood thinners  Pt denies issues with constipation  No A fib or A flutter Have any cardiac testing pending--no  LOA: independent  Prep: suprep   Patient's chart reviewed by Cathlyn Parsons CNRA prior to previsit and patient appropriate for the [facility].  Previsit completed and red dot placed by patient's name on their procedure day (on provider's schedule).     PV competed with patient. Prep instructions sent via mychart and home address. Goodrx coupon for [facility] provided to use for price reduction if needed.

## 2023-02-21 DIAGNOSIS — L570 Actinic keratosis: Secondary | ICD-10-CM | POA: Diagnosis not present

## 2023-02-21 DIAGNOSIS — D2261 Melanocytic nevi of right upper limb, including shoulder: Secondary | ICD-10-CM | POA: Diagnosis not present

## 2023-02-21 DIAGNOSIS — Z85820 Personal history of malignant melanoma of skin: Secondary | ICD-10-CM | POA: Diagnosis not present

## 2023-02-21 DIAGNOSIS — D2262 Melanocytic nevi of left upper limb, including shoulder: Secondary | ICD-10-CM | POA: Diagnosis not present

## 2023-02-21 DIAGNOSIS — D485 Neoplasm of uncertain behavior of skin: Secondary | ICD-10-CM | POA: Diagnosis not present

## 2023-02-21 DIAGNOSIS — D225 Melanocytic nevi of trunk: Secondary | ICD-10-CM | POA: Diagnosis not present

## 2023-02-21 DIAGNOSIS — D2272 Melanocytic nevi of left lower limb, including hip: Secondary | ICD-10-CM | POA: Diagnosis not present

## 2023-03-04 ENCOUNTER — Telehealth: Payer: Self-pay | Admitting: Internal Medicine

## 2023-03-04 NOTE — Telephone Encounter (Signed)
Spoke with the patient and she would like to wait a couple of days to see if she is well enough to do the prep.  Patient will call back to reschedule if fever persists.

## 2023-03-04 NOTE — Telephone Encounter (Signed)
Inbound call from patient, states she has developed a chest cold, has a low grade fever of 99.2. States she took a Museum/gallery exhibitions officer and it was negative. States she is congested and having chest and throat pain. Would like see if she can proceed with colonoscopy on 2/24 at 9:00 AM.

## 2023-03-11 ENCOUNTER — Encounter: Payer: Medicare Other | Admitting: Internal Medicine

## 2023-04-04 ENCOUNTER — Ambulatory Visit (AMBULATORY_SURGERY_CENTER): Payer: Medicare Other

## 2023-04-04 VITALS — Ht 68.0 in | Wt 160.0 lb

## 2023-04-04 DIAGNOSIS — Z1211 Encounter for screening for malignant neoplasm of colon: Secondary | ICD-10-CM

## 2023-04-04 NOTE — Progress Notes (Signed)
 No egg or soy allergy known to patient  No issues known to pt with past sedation with any surgeries or procedures Patient denies ever being told they had issues or difficulty with intubation  No FH of Malignant Hyperthermia Pt is not on diet pills Pt is not on home 02  Pt is not on blood thinners  Pt denies issues with chronic constipation  No A fib or A flutter Have any cardiac testing pending--no Ambulates independently

## 2023-04-16 ENCOUNTER — Encounter: Payer: Self-pay | Admitting: Internal Medicine

## 2023-04-16 ENCOUNTER — Ambulatory Visit (AMBULATORY_SURGERY_CENTER): Payer: Medicare Other | Admitting: Internal Medicine

## 2023-04-16 VITALS — BP 110/89 | HR 65 | Temp 97.7°F | Resp 15 | Ht 68.0 in | Wt 160.0 lb

## 2023-04-16 DIAGNOSIS — Z1211 Encounter for screening for malignant neoplasm of colon: Secondary | ICD-10-CM | POA: Diagnosis not present

## 2023-04-16 DIAGNOSIS — D128 Benign neoplasm of rectum: Secondary | ICD-10-CM | POA: Diagnosis not present

## 2023-04-16 DIAGNOSIS — Z860101 Personal history of adenomatous and serrated colon polyps: Secondary | ICD-10-CM

## 2023-04-16 DIAGNOSIS — I10 Essential (primary) hypertension: Secondary | ICD-10-CM | POA: Diagnosis not present

## 2023-04-16 DIAGNOSIS — K5730 Diverticulosis of large intestine without perforation or abscess without bleeding: Secondary | ICD-10-CM

## 2023-04-16 DIAGNOSIS — Z860100 Personal history of colon polyps, unspecified: Secondary | ICD-10-CM

## 2023-04-16 DIAGNOSIS — K621 Rectal polyp: Secondary | ICD-10-CM

## 2023-04-16 DIAGNOSIS — K648 Other hemorrhoids: Secondary | ICD-10-CM | POA: Diagnosis not present

## 2023-04-16 MED ORDER — SODIUM CHLORIDE 0.9 % IV SOLN: 500.0000 mL | Freq: Once | INTRAVENOUS | Status: DC

## 2023-04-16 NOTE — Progress Notes (Signed)
 Called to room to assist during endoscopic procedure.  Patient ID and intended procedure confirmed with present staff. Received instructions for my participation in the procedure from the performing physician.

## 2023-04-16 NOTE — Progress Notes (Signed)
 Pt's states no medical or surgical changes since previsit or office visit.

## 2023-04-16 NOTE — Op Note (Signed)
 [facility] Patient Name: Lisa Medina Procedure Date: 04/16/2023 10:10 AM MRN: [id number] Endoscopist: Wilhemina Bonito. Marina Goodell , MD, [id number] Age: 73 Referring MD:  Date of Birth: 1950-07-27 Gender: Female Account #: [id number] Procedure:                Colonoscopy with cold snare polypectomy x 1 Indications:              High risk colon cancer surveillance: Personal                            history of non-advanced adenoma. Previous                            examinations 2003, 2008, 2014 Medicines:                Monitored Anesthesia Care Procedure:                Pre-Anesthesia Assessment:                           - Prior to the procedure, a History and Physical                            was performed, and patient medications and                            allergies were reviewed. The patient's tolerance of                            previous anesthesia was also reviewed. The risks                            and benefits of the procedure and the sedation                            options and risks were discussed with the patient.                            All questions were answered, and informed consent                            was obtained. Prior Anticoagulants: The patient has                            taken no anticoagulant or antiplatelet agents. ASA                            Grade Assessment: II - A patient with mild systemic                            disease. After reviewing the risks and benefits,                            the patient was deemed in satisfactory condition to  undergo the procedure.                           After obtaining informed consent, the colonoscope                            was passed under direct vision. Throughout the                            procedure, the patient's blood pressure, pulse, and                            oxygen saturations were monitored continuously. The                            Olympus  Scope SN [id number] was introduced through the                            anus and advanced to the the cecum, identified by                            appendiceal orifice and ileocecal valve. The                            ileocecal valve, appendiceal orifice, and rectum                            were photographed. The quality of the bowel                            preparation was excellent. The colonoscopy was                            performed without difficulty. The patient tolerated                            the procedure well. The bowel preparation used was                            SUPREP via split dose instruction. Scope In: 10:24:10 AM Scope Out: 10:39:36 AM Scope Withdrawal Time: 0 hours 10 minutes 24 seconds  Total Procedure Duration: 0 hours 15 minutes 26 seconds  Findings:                 A 3 mm polyp was found in the rectum. The polyp was                            sessile. The polyp was removed with a cold snare.                            Resection and retrieval were complete.                           Many diverticula were found in the left colon.  Internal hemorrhoids were found during retroflexion.                           The exam was otherwise without abnormality on                            direct and retroflexion views. Complications:            No immediate complications. Estimated blood loss:                            None. Estimated Blood Loss:     Estimated blood loss: none. Impression:               - One 3 mm polyp in the rectum, removed with a cold                            snare. Resected and retrieved.                           - Diverticulosis in the left colon.                           - Internal hemorrhoids.                           - The examination was otherwise normal on direct                            and retroflexion views. Recommendation:           - Repeat colonoscopy is not recommended for                             surveillance.                           - Patient has a contact number available for                            emergencies. The signs and symptoms of potential                            delayed complications were discussed with the                            patient. Return to normal activities tomorrow.                            Written discharge instructions were provided to the                            patient.                           - Resume previous diet.                           -  Continue present medications.                           - Await pathology results. Wilhemina Bonito. Marina Goodell, MD 04/16/2023 10:44:26 AM This report has been signed electronically.

## 2023-04-16 NOTE — Patient Instructions (Signed)
 Repeat colonoscopy not recommended at this time.  Resume previous diet and medications.  Handouts provided on diverticulosis, hemorrhoids, and polyps.    YOU HAD AN ENDOSCOPIC PROCEDURE TODAY AT THE [facility]:   Refer to the procedure report that was given to you for any specific questions about what was found during the examination.  If the procedure report does not answer your questions, please call your gastroenterologist to clarify.  If you requested that your care partner not be given the details of your procedure findings, then the procedure report has been included in a sealed envelope for you to review at your convenience later.  YOU SHOULD EXPECT: Some feelings of bloating in the abdomen. Passage of more gas than usual.  Walking can help get rid of the air that was put into your GI tract during the procedure and reduce the bloating. If you had a lower endoscopy (such as a colonoscopy or flexible sigmoidoscopy) you may notice spotting of blood in your stool or on the toilet paper. If you underwent a bowel prep for your procedure, you may not have a normal bowel movement for a few days.  Please Note:  You might notice some irritation and congestion in your nose or some drainage.  This is from the oxygen used during your procedure.  There is no need for concern and it should clear up in a day or so.  SYMPTOMS TO REPORT IMMEDIATELY:  Following lower endoscopy (colonoscopy or flexible sigmoidoscopy):  Excessive amounts of blood in the stool  Significant tenderness or worsening of abdominal pains  Swelling of the abdomen that is new, acute  Fever of 100F or higher  For urgent or emergent issues, a gastroenterologist can be reached at any hour by calling [phone]. Do not use MyChart messaging for urgent concerns.    DIET:  We do recommend a small meal at first, but then you may proceed to your regular diet.  Drink plenty of fluids but you should avoid alcoholic beverages  for 24 hours.  ACTIVITY:  You should plan to take it easy for the rest of today and you should NOT DRIVE or use heavy machinery until tomorrow (because of the sedation medicines used during the test).    FOLLOW UP: Our staff will call the number listed on your records the next business day following your procedure.  We will call around 7:15- 8:00 am to check on you and address any questions or concerns that you may have regarding the information given to you following your procedure. If we do not reach you, we will leave a message.     If any biopsies were taken you will be contacted by phone or by letter within the next 1-3 weeks.  Please call us at [phone] if you have not heard about the biopsies in 3 weeks.    SIGNATURES/CONFIDENTIALITY: You and/or your care partner have signed paperwork which will be entered into your electronic medical record.  These signatures attest to the fact that that the information above on your After Visit Summary has been reviewed and is understood.  Full responsibility of the confidentiality of this discharge information lies with you and/or your care-partner.

## 2023-04-16 NOTE — Progress Notes (Signed)
 HISTORY OF PRESENT ILLNESS:  Lisa Medina is a 73 y.o. female with a history of diminutive adenoma.  Previous examinations 2003, 2008, 2014.  For surveillance  REVIEW OF SYSTEMS:  All non-GI ROS negative except for  Past Medical History:  Diagnosis Date   Allergy    Anemia    borderline   Diverticulitis 06/2021   GERD (gastroesophageal reflux disease)    Hypertension    off meds x 8 years now-bp controlled   Leaky heart valve    Urinary tract bacterial infections     Past Surgical History:  Procedure Laterality Date   CHOLECYSTECTOMY     COLONOSCOPY  2003, 2008   MOHS SURGERY  10/23/2022   melanoma   UPPER GI ENDOSCOPY      Social History [facility] [place]  reports that she quit smoking about 31 years ago. Her smoking use included cigarettes. She started smoking about 46 years ago. She has a 3.8 pack-year smoking history. She has never been exposed to tobacco smoke. She has never used smokeless tobacco. She reports current alcohol use of about 2.0 standard drinks of alcohol per week. She reports that she does not use drugs.  family history includes Colon polyps in her mother; Heart disease in her father; Hyperlipidemia in her brother and mother; Hypertension in her brother, father, maternal grandfather, maternal grandmother, mother, paternal grandfather, and paternal grandmother; Stroke (age of onset: 1) in her mother.  Allergies  Allergen Reactions   Nitrofurantoin Swelling       PHYSICAL EXAMINATION: Vital signs: BP 117/71   Pulse 69   Temp 97.7 F (36.5 C) (Temporal)   Ht 5\' 8"  (1.727 m)   Wt 160 lb (72.6 kg)   SpO2 97%   BMI 24.33 kg/m  General: Well-developed, well-nourished, no acute distress HEENT: Sclerae are anicteric, conjunctiva pink. Oral mucosa intact Lungs: Clear Heart: Regular Abdomen: soft, nontender, nondistended, no obvious ascites, no peritoneal signs, normal bowel sounds. No organomegaly. Extremities: No edema Psychiatric: alert and  oriented x3. Cooperative     ASSESSMENT:   History of diminutive adenoma  PLAN:  Surveillance colonoscopy

## 2023-04-16 NOTE — Progress Notes (Signed)
 Drowsy, VSS, resps reg and even. Report to RN

## 2023-04-17 ENCOUNTER — Telehealth: Payer: Self-pay

## 2023-04-17 NOTE — Telephone Encounter (Signed)
  Follow up Call-     04/16/2023    9:48 AM  Call back number  Post procedure Call Back phone  # [phone]  Permission to leave phone message Yes     Patient questions:  Do you have a fever, pain , or abdominal swelling? No. Pain Score  0 *  Have you tolerated food without any problems? Yes.    Have you been able to return to your normal activities? Yes.    Do you have any questions about your discharge instructions: Diet   No. Medications  No. Follow up visit  No.  Do you have questions or concerns about your Care? No.  Actions: * If pain score is 4 or above: No action needed, pain <4.

## 2023-04-18 ENCOUNTER — Encounter: Payer: Self-pay | Admitting: Internal Medicine

## 2023-04-18 LAB — SURGICAL PATHOLOGY

## 2023-04-28 ENCOUNTER — Other Ambulatory Visit: Payer: Self-pay | Admitting: Internal Medicine

## 2023-06-05 ENCOUNTER — Ambulatory Visit: Payer: Self-pay

## 2023-06-05 NOTE — Telephone Encounter (Signed)
 Copied from CRM [id number]. Topic: Clinical - Red Word Triage >> Jun 05, 2023  2:16 PM Adonis Hoot wrote: Red Word that prompted transfer to Nurse Triage: lower abdominal pain,cramping,possible diverticulitis  Chief Complaint: Abdominal pain Symptoms: Dull ache  Frequency: Since yesterday  Pertinent Negatives: Patient denies fever Disposition: [] ED /[] Urgent Care (no appt availability in office) / [x] Appointment(In office/virtual)/ []  [employer] [facility]/ [] Home Care/ [] Refused Recommended Disposition /[] [facility]/ []  Follow-up with PCP Additional Notes: Patient called in to report mild/moderate lower abdominal pain that has been present since yesterday evening. Patient stated the pressure is constant, but the intensity of the pain comes and goes. Patient described pain as a "dull ache". Patient has a history of diverticulitis and gallstones and is trying to be proactive. Patient denied fever, diarrhea and vomiting. Advised patient to be seen within 24 hours, per protocol. Scheduled with PCP tomorrow morning. Provided care advice and instructed patient to call back if symptoms worsen. Patient complied.   Reason for Disposition  [1] MODERATE pain (e.g., interferes with normal activities) AND [2] pain comes and goes (cramps) AND [3] present > 24 hours  (Exception: Pain with Vomiting or Diarrhea - see that Guideline.)  Answer Assessment - Initial Assessment Questions 1. LOCATION: "Where does it hurt?"      Center, lower abdomen 2. RADIATION: "Does the pain shoot anywhere else?" (e.g., chest, back)     Denies radiation  3. ONSET: "When did the pain begin?" (e.g., minutes, hours or days ago)      Yesterday evening 4. SUDDEN: "Gradual or sudden onset?"     Sudden 5. PATTERN "Does the pain come and go, or is it constant?"    - If it comes and goes: "How long does it last?" "Do you have pain now?"     (Note: Comes and goes means the pain is intermittent. It goes away completely between  bouts.)    - If constant: "Is it getting better, staying the same, or getting worse?"      (Note: Constant means the pain never goes away completely; most serious pain is constant and gets worse.)      States pressure is constant, pain intensity comes and goes  6. SEVERITY: "How bad is the pain?"  (e.g., Scale 1-10; mild, moderate, or severe)    - MILD (1-3): Doesn't interfere with normal activities, abdomen soft and not tender to touch.     - MODERATE (4-7): Interferes with normal activities or awakens from sleep, abdomen tender to touch.     - SEVERE (8-10): Excruciating pain, doubled over, unable to do any normal activities.       "Lower pressure", rates pain a 3 when it flares-up, states pain is not excruciating at this time, but she wants to keep it from worsening, dull ache 7. RECURRENT SYMPTOM: "Have you ever had this type of stomach pain before?" If Yes, ask: "When was the last time?" and "What happened that time?"      States this happened about 2 years ago and she had gallstones and diverticulitis  8. CAUSE: "What do you think is causing the stomach pain?"     Diverticulitis  9. RELIEVING/AGGRAVATING FACTORS: "What makes it better or worse?" (e.g., antacids, bending or twisting motion, bowel movement)     Tylenol  makes it better, walking makes it better 10. OTHER SYMPTOMS: "Do you have any other symptoms?" (e.g., back pain, diarrhea, fever, urination pain, vomiting)     Denies fever, denies diarrhea, denies  vomiting  Protocols used: Abdominal Pain - Female-A-AH

## 2023-06-06 ENCOUNTER — Ambulatory Visit: Payer: Self-pay | Admitting: Internal Medicine

## 2023-06-06 ENCOUNTER — Encounter: Payer: Self-pay | Admitting: Internal Medicine

## 2023-06-06 ENCOUNTER — Ambulatory Visit (INDEPENDENT_AMBULATORY_CARE_PROVIDER_SITE_OTHER): Admitting: Internal Medicine

## 2023-06-06 VITALS — BP 122/76 | HR 67 | Temp 99.1°F | Ht 68.25 in | Wt 161.0 lb

## 2023-06-06 DIAGNOSIS — R1030 Lower abdominal pain, unspecified: Secondary | ICD-10-CM | POA: Insufficient documentation

## 2023-06-06 LAB — POC URINALSYSI DIPSTICK (AUTOMATED)
Bilirubin, UA: NEGATIVE
Glucose, UA: NEGATIVE
Ketones, UA: NEGATIVE
Leukocytes, UA: NEGATIVE
Nitrite, UA: NEGATIVE
Protein, UA: NEGATIVE
Spec Grav, UA: 1.015 (ref 1.010–1.025)
Urobilinogen, UA: 0.2 U/dL
pH, UA: 6 (ref 5.0–8.0)

## 2023-06-06 MED ORDER — AMOXICILLIN-POT CLAVULANATE 875-125 MG PO TABS: 1.0000 | ORAL_TABLET | Freq: Two times a day (BID) | ORAL | 0 refills | Status: DC

## 2023-06-06 NOTE — Progress Notes (Signed)
 Subjective:    Patient ID: Lisa Medina, female    DOB: Jul 20, 1950, 73 y.o.   MRN: [id number]  HPI Here due to lower abdominal pain  2 days ago--noticed some pressure and pain in mid lower abdomen Tylenol  helps Pain in that area when moving bowels--though not constipated Same as past diverticulitis  Some urinary urgency---chronic No dysuria/hematuria or increased frequency  Current Outpatient Medications on File Prior to Visit  Medication Sig Dispense Refill   Ca Phosphate-Cholecalciferol (CALCIUM  500 + D3) 250-500 MG-UNIT CHEW Chew 1 tablet by mouth as needed.     esomeprazole (NEXIUM) 20 MG capsule Take 20 mg by mouth daily.     estradiol  (ESTRACE ) 0.1 MG/GM vaginal cream USE PEA SIZED AMOUNT VAGINALLY TO VULVA 2 TIMES A WEEK 42.5 g 1   ibuprofen  (ADVIL ) 800 MG tablet Take 1 tablet (800 mg total) by mouth every 8 (eight) hours as needed. 30 tablet 0   LORazepam  (ATIVAN ) 0.5 MG tablet Take 0.5-1 tablets (0.25-0.5 mg total) by mouth daily as needed for anxiety. 30 tablet 0   ondansetron  (ZOFRAN ) 4 MG tablet Take 1 tablet (4 mg total) by mouth every 8 (eight) hours as needed for nausea or vomiting. 10 tablet 0   Probiotic Product (ALIGN PO) Take by mouth.     traZODone  (DESYREL ) 50 MG tablet Take 0.5-1 tablets (25-50 mg total) by mouth at bedtime as needed for sleep. 30 tablet 3   No current facility-administered medications on file prior to visit.    Allergies  Allergen Reactions   Nitrofurantoin Swelling    Past Medical History:  Diagnosis Date   Allergy    Anemia    borderline   Diverticulitis 06/2021   GERD (gastroesophageal reflux disease)    Hypertension    off meds x 8 years now-bp controlled   Leaky heart valve    Urinary tract bacterial infections     Past Surgical History:  Procedure Laterality Date   CHOLECYSTECTOMY     COLONOSCOPY  2003, 2008   MOHS SURGERY  10/23/2022   melanoma   UPPER GI ENDOSCOPY      Family History  Problem Relation Age of  Onset   Colon polyps Mother    Hyperlipidemia Mother    Stroke Mother 51   Hypertension Mother    Heart disease Father    Hypertension Father    Hyperlipidemia Brother    Hypertension Brother    Hypertension Maternal Grandmother    Hypertension Maternal Grandfather    Hypertension Paternal Grandmother    Hypertension Paternal Grandfather    Colon cancer Neg Hx    Esophageal cancer Neg Hx    Rectal cancer Neg Hx    Stomach cancer Neg Hx    Breast cancer Neg Hx     Social History   Socioeconomic History   Marital status: Legally Separated    Spouse name: Read Camel   Number of children: 0   Years of education: some college   Highest education level: Not on file  Occupational History   Occupation: Engineer, site    Comment: Retired   Occupation: Airline pilot and use tax consulting    Comment: Part time  Tobacco Use   Smoking status: Former    Current packs/day: 0.00    Average packs/day: 0.3 packs/day for 15.0 years (3.8 ttl pk-yrs)    Types: Cigarettes    Start date: 01/15/1977    Quit date: 01/16/1992    Years since quitting: 31.4  Passive exposure: Never   Smokeless tobacco: Never  Vaping Use   Vaping status: Never Used  Substance and Sexual Activity   Alcohol use: Yes    Alcohol/week: 2.0 standard drinks of alcohol    Types: 2 Glasses of wine per week    Comment: 1-2 times weekly   Drug use: No   Sexual activity: Yes    Birth control/protection: Post-menopausal  Other Topics Concern   Not on file  Social History Narrative   Separated   Husband would still be health care POA   Full code- desires CPR but does not want prolonged life support if futile.      09/14/19   From: [place]    Work: retired, works part-time - Airline pilot and use taxes      Family: brother in [place] and brother in [place]      Enjoys: exercise      Exercise: working out 3 times a week- weights and swimming   Diet: fair - breakfast, lunch, and light  dinner      Safety   Seat belts: Yes    Guns: No   Safe in relationships: Yes    Social Drivers of Corporate investment banker Strain: Not on [employer] Insecurity: Not on file  Transportation Needs: Not on file  Physical Activity: Not on file  Stress: Not on file  Social Connections: Not on file  Intimate Partner Violence: Not on file   Review of Systems ?low grade fever No N/V Eating okay--but appetite is off     Objective:   Physical Exam Constitutional:      General: She is not in acute distress. Pulmonary:     Effort: Pulmonary effort is normal.     Breath sounds: Normal breath sounds. No wheezing or rales.  Abdominal:     General: Bowel sounds are normal. There is no distension.     Palpations: Abdomen is soft.     Tenderness: There is no guarding or rebound.     Comments: Mild RLQ and LLQ tenderness Moderate suprapubic tenderness  Musculoskeletal:     Cervical back: Neck supple.  Lymphadenopathy:     Cervical: No cervical adenopathy.  Neurological:     Mental Status: She is alert.            Assessment & Plan:

## 2023-06-06 NOTE — Addendum Note (Signed)
 Addended by: Franne Ivory on: 06/06/2023 09:54 AM   Modules accepted: Orders

## 2023-06-06 NOTE — Telephone Encounter (Signed)
 Noted---I will assess her at the visit today

## 2023-06-06 NOTE — Assessment & Plan Note (Addendum)
 No clear urinary symptoms--but will check Certainly compatible with uncomplicated diverticulitis--like she had in 2023 Urinalysis negative for leuks/nitrite Will go ahead with empiric Rx with augmentin  875 bid for 7 days

## 2023-07-01 DIAGNOSIS — L2989 Other pruritus: Secondary | ICD-10-CM | POA: Diagnosis not present

## 2023-07-01 DIAGNOSIS — D225 Melanocytic nevi of trunk: Secondary | ICD-10-CM | POA: Diagnosis not present

## 2023-07-01 DIAGNOSIS — D485 Neoplasm of uncertain behavior of skin: Secondary | ICD-10-CM | POA: Diagnosis not present

## 2023-07-01 DIAGNOSIS — L538 Other specified erythematous conditions: Secondary | ICD-10-CM | POA: Diagnosis not present

## 2023-07-01 DIAGNOSIS — Z85820 Personal history of malignant melanoma of skin: Secondary | ICD-10-CM | POA: Diagnosis not present

## 2023-07-01 DIAGNOSIS — L820 Inflamed seborrheic keratosis: Secondary | ICD-10-CM | POA: Diagnosis not present

## 2023-07-01 DIAGNOSIS — D2272 Melanocytic nevi of left lower limb, including hip: Secondary | ICD-10-CM | POA: Diagnosis not present

## 2023-07-01 DIAGNOSIS — D2261 Melanocytic nevi of right upper limb, including shoulder: Secondary | ICD-10-CM | POA: Diagnosis not present

## 2023-07-01 DIAGNOSIS — D2262 Melanocytic nevi of left upper limb, including shoulder: Secondary | ICD-10-CM | POA: Diagnosis not present

## 2023-07-01 DIAGNOSIS — D2362 Other benign neoplasm of skin of left upper limb, including shoulder: Secondary | ICD-10-CM | POA: Diagnosis not present

## 2023-08-15 DIAGNOSIS — D2262 Melanocytic nevi of left upper limb, including shoulder: Secondary | ICD-10-CM | POA: Diagnosis not present

## 2023-08-15 DIAGNOSIS — D225 Melanocytic nevi of trunk: Secondary | ICD-10-CM | POA: Diagnosis not present

## 2023-08-29 DIAGNOSIS — D225 Melanocytic nevi of trunk: Secondary | ICD-10-CM | POA: Diagnosis not present

## 2023-08-29 DIAGNOSIS — L905 Scar conditions and fibrosis of skin: Secondary | ICD-10-CM | POA: Diagnosis not present

## 2023-09-25 DIAGNOSIS — Z1231 Encounter for screening mammogram for malignant neoplasm of breast: Secondary | ICD-10-CM

## 2023-10-21 DIAGNOSIS — R208 Other disturbances of skin sensation: Secondary | ICD-10-CM | POA: Diagnosis not present

## 2023-10-21 DIAGNOSIS — C44311 Basal cell carcinoma of skin of nose: Secondary | ICD-10-CM | POA: Diagnosis not present

## 2023-10-21 DIAGNOSIS — D2261 Melanocytic nevi of right upper limb, including shoulder: Secondary | ICD-10-CM | POA: Diagnosis not present

## 2023-10-21 DIAGNOSIS — L409 Psoriasis, unspecified: Secondary | ICD-10-CM | POA: Diagnosis not present

## 2023-10-21 DIAGNOSIS — D2272 Melanocytic nevi of left lower limb, including hip: Secondary | ICD-10-CM | POA: Diagnosis not present

## 2023-10-21 DIAGNOSIS — L820 Inflamed seborrheic keratosis: Secondary | ICD-10-CM | POA: Diagnosis not present

## 2023-10-21 DIAGNOSIS — D2262 Melanocytic nevi of left upper limb, including shoulder: Secondary | ICD-10-CM | POA: Diagnosis not present

## 2023-10-21 DIAGNOSIS — L538 Other specified erythematous conditions: Secondary | ICD-10-CM | POA: Diagnosis not present

## 2023-10-21 DIAGNOSIS — Z85820 Personal history of malignant melanoma of skin: Secondary | ICD-10-CM | POA: Diagnosis not present

## 2023-10-21 DIAGNOSIS — D485 Neoplasm of uncertain behavior of skin: Secondary | ICD-10-CM | POA: Diagnosis not present

## 2023-10-21 DIAGNOSIS — D225 Melanocytic nevi of trunk: Secondary | ICD-10-CM | POA: Diagnosis not present

## 2023-10-24 ENCOUNTER — Telehealth: Payer: Self-pay

## 2023-10-24 MED ORDER — ESTRADIOL 0.01 % VA CREA: 1.0000 | TOPICAL_CREAM | Freq: Every day | VAGINAL | 0 refills | Status: AC

## 2023-10-24 NOTE — Telephone Encounter (Signed)
 Rx sent electronically.

## 2023-10-25 ENCOUNTER — Encounter

## 2023-11-21 ENCOUNTER — Ambulatory Visit
Admission: RE | Admit: 2023-11-21 | Discharge: 2023-11-21 | Disposition: A | Discharge status: Home / Self Care | Source: Ambulatory Visit | Attending: Family | Admitting: Family

## 2023-11-21 DIAGNOSIS — Z1231 Encounter for screening mammogram for malignant neoplasm of breast: Secondary | ICD-10-CM | POA: Diagnosis not present

## 2023-11-27 ENCOUNTER — Other Ambulatory Visit: Payer: Self-pay

## 2023-11-27 ENCOUNTER — Ambulatory Visit: Payer: Self-pay

## 2023-11-27 ENCOUNTER — Ambulatory Visit (INDEPENDENT_AMBULATORY_CARE_PROVIDER_SITE_OTHER)

## 2023-11-27 VITALS — BP 138/86 | HR 58 | Temp 98.9°F | Ht 68.25 in | Wt 163.0 lb

## 2023-11-27 DIAGNOSIS — G4700 Insomnia, unspecified: Secondary | ICD-10-CM

## 2023-11-27 DIAGNOSIS — Z131 Encounter for screening for diabetes mellitus: Secondary | ICD-10-CM | POA: Diagnosis not present

## 2023-11-27 DIAGNOSIS — F419 Anxiety disorder, unspecified: Secondary | ICD-10-CM | POA: Insufficient documentation

## 2023-11-27 DIAGNOSIS — Z136 Encounter for screening for cardiovascular disorders: Secondary | ICD-10-CM

## 2023-11-27 DIAGNOSIS — Z85820 Personal history of malignant melanoma of skin: Secondary | ICD-10-CM | POA: Insufficient documentation

## 2023-11-27 DIAGNOSIS — I10 Essential (primary) hypertension: Secondary | ICD-10-CM | POA: Diagnosis not present

## 2023-11-27 DIAGNOSIS — N1831 Chronic kidney disease, stage 3a: Secondary | ICD-10-CM

## 2023-11-27 DIAGNOSIS — K9089 Other intestinal malabsorption: Secondary | ICD-10-CM | POA: Diagnosis not present

## 2023-11-27 LAB — LIPID PANEL
Cholesterol: 196 mg/dL (ref 0–200)
HDL: 58.7 mg/dL (ref >39.00)
LDL Cholesterol: 113 mg/dL — ABNORMAL HIGH (ref 0–99)
NonHDL: 137.19
Total CHOL/HDL Ratio: 3
Triglycerides: 120 mg/dL (ref 0.0–149.0)
VLDL: 24 mg/dL (ref 0.0–40.0)

## 2023-11-27 LAB — BASIC METABOLIC PANEL WITH GFR
BUN: 11 mg/dL (ref 6–23)
CO2: 30 meq/L (ref 19–32)
Calcium: 9 mg/dL (ref 8.4–10.5)
Chloride: 103 meq/L (ref 96–112)
Creatinine, Ser: 0.95 mg/dL (ref 0.40–1.20)
GFR: 60.02 mL/min (ref >60.00)
Glucose, Bld: 90 mg/dL (ref 70–99)
Potassium: 4.2 meq/L (ref 3.5–5.1)
Sodium: 139 meq/L (ref 135–145)

## 2023-11-27 LAB — HEMOGLOBIN A1C: Hgb A1c MFr Bld: 5.6 % (ref 4.6–6.5)

## 2023-11-27 MED ORDER — LISINOPRIL 5 MG PO TABS: 5.0000 mg | ORAL_TABLET | Freq: Every day | ORAL | 0 refills | Status: AC

## 2023-11-27 MED ORDER — HYDROXYZINE PAMOATE 25 MG PO CAPS: 25.0000 mg | ORAL_CAPSULE | Freq: Three times a day (TID) | ORAL | 0 refills | Status: DC | PRN

## 2023-11-27 MED ORDER — TRAZODONE HCL 50 MG PO TABS: 25.0000 mg | ORAL_TABLET | Freq: Every evening | ORAL | Status: AC | PRN

## 2023-11-27 MED ORDER — CHOLESTYRAMINE LIGHT 4 G PO PACK
2.0000 g | PACK | Freq: Two times a day (BID) | ORAL | 0 refills | Status: AC
Start: 2023-11-27 — End: 2023-12-27

## 2023-11-27 MED ORDER — LISINOPRIL 5 MG PO TABS: 5.0000 mg | ORAL_TABLET | Freq: Every day | ORAL | 3 refills | Status: DC

## 2023-11-27 NOTE — Patient Instructions (Signed)
 Thank you for visiting [facility] today! Here's what we talked about: - START Lisinopril daily, check BP daily at home and send 7d worth of readings  - Start Cholestyramine - STOP Ativan , STOP ibuprofen /advil  for your kidneys - Extra strength tylenol  2 tablets every 8hrs as needed

## 2023-11-27 NOTE — Progress Notes (Signed)
 Subjective:   This visit was conducted in person. The patient gave informed consent to the use of Abridge AI technology to record the contents of the encounter as documented below.   Patient ID: Lisa Medina, female    DOB: 1950-06-22, 73 y.o.   MRN: [id number]   Discussed the use of AI scribe software for clinical note transcription with the patient, who gave verbal consent to proceed.  History of Present Illness Lisa Medina is a 73 year old female with hypertension and chronic kidney disease who presents for evaluation of her blood pressure and kidney function.  She has a history of hypertension with recent home blood pressure readings fluctuating between normal and elevated levels, often higher when she has not slept well. She has not been on blood pressure medication for over ten years, since around menopause. Her current blood pressure reading is 138/86, which she feels is higher than usual. There is a family history of high blood pressure, with her mother and brother also affected. She previously took a combination blood pressure medication but is currently not on any antihypertensive therapy.  Her kidney filtration rate was slightly reduced to 59 two years ago and 56 a year ago, according to her lab results. She was unaware of this diagnosis until recently and has not experienced any symptoms such as swelling or severe tiredness. She is concerned about the progression of this condition and its potential impact on her health.  She experiences occasional acid reflux and has a history of low bone density. She uses vaginal estrogen cream regularly. She has a history of melanoma and is scheduled for Mohs surgery for a basal cell carcinoma in February.  She reports occasional trouble sleeping and has a history of anxiety, which she describes as situational and not as severe as in the past. She previously used Ativan  infrequently for anxiety but is open to trying alternative medications. She  has tried therapy in the past, which was beneficial.  She underwent gallbladder surgery in 2023 and has since experienced increased frequency of loose stools, typically twice a day, which she finds inconvenient, especially when dining out. She attributes this to the absence of her gallbladder and the resulting digestive changes.  She takes trazodone  as needed for sleep, typically a half tablet of 25 mg. She also takes a probiotic and has discontinued Zofran , which was used in the past for nausea. She uses ibuprofen  frequently for aches and pains but is willing to switch to Tylenol  due to concerns about kidney health. She is on Nexium for stomach issues and takes calcium  and vitamin D  supplements.   Review of Systems  All other systems reviewed and are negative.       Allergies  Allergen Reactions   Nitrofurantoin Swelling    Current Outpatient Medications on File Prior to Visit  Medication Sig Dispense Refill   Ca Phosphate-Cholecalciferol (CALCIUM  500 + D3) 250-500 MG-UNIT CHEW Chew 1 tablet by mouth as needed.     esomeprazole (NEXIUM) 20 MG capsule Take 20 mg by mouth daily.     estradiol  (ESTRACE ) 0.01 % CREA vaginal cream Place 1 Applicatorful vaginally at bedtime. 42.5 g 0   Probiotic Product (ALIGN PO) Take by mouth.     No current facility-administered medications on file prior to visit.    BP 138/86 (BP Location: Left Arm, Patient Position: Sitting, Cuff Size: Normal)   Pulse (!) 58   Temp 98.9 F (37.2 C) (Oral)   Ht 5' 8.25 (  1.734 m)   Wt 163 lb (73.9 kg)   SpO2 97%   BMI 24.60 kg/m   Objective:      Physical Exam GENERAL: Alert, cooperative, well developed, no acute distress. HEAD: Normocephalic atraumatic. EYES: Extraocular movements intact BL, pupils round, equal and reactive to light BL, conjunctivae normal BL. EXTREMITIES: No cyanosis or edema. NEUROLOGICAL: Oriented to person, place and time, no gait abnormalities, moves all extremities without gross  motor or sensory deficit.         Assessment & Plan:   Assessment & Plan Chronic kidney disease, stage 3a Mild CKD with GFR 56 a yr ago, for this reason BP goal should be less than 130/80, patient is above that today in the clinic and reportedly in the 130s to 140s systolic at home, will start lisinopril.  No A1c on file, will rule out coexisting diabetes as a potential risk factor to mitigate progression.  - Ordered repeat kidney function tests. - Monitor blood pressure closely. - Ordered diabetes screening. - Lisinopril 5 mg daily  Essential hypertension Was previously untreated, slightly elevated BP, target <130/80 due to CKD. Lisinopril chosen for renal protection.  - Started lisinopril 5 mg daily can uptitrate as needed. - Instructed to monitor BP daily and report in 7 days. - Educated on BP cuff placement and technique.  Bile acid malabsorption syndrome Underwent cholecystectomy in 2023, since then loose stools have increased in frequency from once to twice daily, will trial cholestyramine as below.  - Started cholestyramine, half packet BID, can uptitrate if needed. - Instructed to monitor for constipation and vitamin deficiencies. - Consider vitamin ADE or K supplementation if long-term use needed.  Anxiety Intermittent anxiety, Ativan  not recommended, counseled patient about the risks and the fact that we will discontinue and no longer be refilling at this time.  Not agreeable to daily medication or therapy at this time, low-dose hydroxyzine suggested.  - Discontinued Ativan . - Prescribed hydroxyzine 25 mg, take half tablet PRN. - Educated on hydroxyzine side effects, including drowsiness and dizziness.  General Health Maintenance - Ordered cholesterol level and A1c. - Scheduled follow-up in 4 weeks for Medicare Wellness evaluation.      Return in about 4 weeks (around 12/25/2023) for CPE, then medicare wellness with coach 1 week later, .   Artie Takayama K Burnis Halling, MD   11/27/23     Contains text generated by Abridge.

## 2023-11-29 ENCOUNTER — Telehealth: Payer: Self-pay

## 2023-11-29 ENCOUNTER — Other Ambulatory Visit (HOSPITAL_COMMUNITY): Payer: Self-pay

## 2023-11-29 MED ORDER — HYDROXYZINE PAMOATE 25 MG PO CAPS: 25.0000 mg | ORAL_CAPSULE | Freq: Three times a day (TID) | ORAL | 0 refills | Status: AC | PRN

## 2023-11-29 MED ORDER — ATORVASTATIN CALCIUM 20 MG PO TABS: 20.0000 mg | ORAL_TABLET | Freq: Every day | ORAL | 3 refills | Status: AC

## 2023-11-29 NOTE — Telephone Encounter (Signed)
 Pharmacy Patient Advocate Encounter  Received notification from [facility] that Prior Authorization for Hydroxyzine Pamoate 25 caps has been APPROVED from 11/29/23 to 01/14/98. Ran test claim, Copay is $6.89. This test claim was processed through [facility]- copay amounts may vary at other pharmacies due to pharmacy/plan contracts, or as the patient moves through the different stages of their insurance plan.   PA #/Case ID/Reference #: # [id number]

## 2023-11-29 NOTE — Addendum Note (Signed)
 Addended by: KALLIE CLOTILDA SQUIBB on: 11/29/2023 08:26 AM   Modules accepted: Orders

## 2023-11-29 NOTE — Telephone Encounter (Signed)
 Pharmacy Patient Advocate Encounter   Received notification from Onbase that prior authorization for Hydroxyzine Pamoate 25 caps is required/requested.   Insurance verification completed.   The patient is insured through [facility].   Per test claim: PA required; PA started via CoverMyMeds. KEY [id number] . Waiting for clinical questions to populate.

## 2024-01-02 ENCOUNTER — Ambulatory Visit (INDEPENDENT_AMBULATORY_CARE_PROVIDER_SITE_OTHER): Payer: Self-pay

## 2024-01-02 VITALS — BP 122/70 | HR 55 | Temp 98.0°F | Ht 68.0 in | Wt 165.0 lb

## 2024-01-02 DIAGNOSIS — Z23 Encounter for immunization: Secondary | ICD-10-CM | POA: Diagnosis not present

## 2024-01-02 DIAGNOSIS — I10 Essential (primary) hypertension: Secondary | ICD-10-CM | POA: Diagnosis not present

## 2024-01-02 DIAGNOSIS — M85851 Other specified disorders of bone density and structure, right thigh: Secondary | ICD-10-CM

## 2024-01-02 DIAGNOSIS — E7849 Other hyperlipidemia: Secondary | ICD-10-CM | POA: Diagnosis not present

## 2024-01-02 DIAGNOSIS — Z0000 Encounter for general adult medical examination without abnormal findings: Secondary | ICD-10-CM

## 2024-01-02 NOTE — Progress Notes (Signed)
 Assessment & Plan:   This visit was conducted in person. The patient gave informed consent to the use of Abridge AI technology to record the contents of the encounter as documented below.  Assessment & Plan Adult Wellness Visit Routine wellness visit with no significant concerns. Discussed lifestyle modifications, vaccination status, and advanced directives. - Administered Prevnar vaccine. - Encouraged increased intake of fruits and vegetables. - Encouraged regular physical activity, including weight training and swimming. - Encouraged adequate hydration. - Discussed advanced directives and recommended legal consultation for healthcare power of attorney.   I have personally reviewed and have noted: 1. The patient's medical and social history 2. Their use of alcohol, tobacco or illicit drugs 3. Their current medications and supplements 4. The patient's functional ability including ADL's, fall risks, home safety risks and hearing or visual impairment. 5. Diet and physical activities 6. Evidence for depression or mood disorder   Colonoscopy: In April 2025, one polyp, not precancerous, no repeat recommended Mammogram: Nov 2025 WNL DEXA: 2023 showed osteopenia, will repeat  Pap: Dc'd, last one in 2018 normal  Labs:Hep C, Lipid and A1c UTD Immunizations: Prevnar today Diet and Exercise: Needs to increase fruit and veg, exercise weights [facility] Sleep and mood: Waking up twice to go to bathroom due to drinking caffienated drinks before bed, No mood issues Dental and vision:UTD ADLs and IADLs: Capable Cognitive: Intact to orientation, naming, recall and repetition Fall risk and home safety: No concerns Health counselling given: Exercise to reduce fall risk  Advanced Directives Patient does have advanced directives including healthcare power of attorney. She does not have a copy in the electronic medical record. See social hx.   Osteopenia of right  hip Osteopenia in the right hip with a 10-year major fracture risk of 15% and hip fracture risk of 3%. Treatment warranted based on elevated hip fracture risk.  Discussed bisphosphonates as a treatment option, pt would like to defer and repeat DEXA, treatment decision TBD based on results.  - Ordered repeat bone density scan at [facility]. - Continue calcium  and vitamin D  supplementation.   Essential hypertension Hypertension managed with lisinopril . Recent adjustment to half a pill due to blood pressure control. - Continue lisinopril  at half a pill daily. - Refill lisinopril  as 2.5 mg tablets when due.  Hyperlipidemia Managed with Lipitor. No adverse effects reported. - Continue Lipitor as prescribed. - Encouraged dietary modifications to support cholesterol management.   Follow-up: Return in about 1 year (around 01/01/2025) for CPE with fasting labs 1 week prior.        Subjective:   Patient ID: ABREE ROMICK, female    DOB: Sep 25, 1950  Age: 73 y.o. MRN: [id number]  Patient Care Team: Bennett Reuben POUR, MD as PCP - General (Family Medicine) Cary Doffing, MD as Consulting Physician (Dermatology)   CC:  Chief Complaint  Patient presents with   Annual Exam    Pt has traditional [employer] and [employer].  Discuss recent labs.     LORELEY SCHWALL is a 73 y.o. female here for annual physical and f/u on chronic conditions, see assessment and plan for further details  Discussed the use of AI scribe software for clinical note transcription with the patient, who gave verbal consent to proceed.  History of Present Illness ARIYA BOHANNON is a 73 year old female who presents for a routine follow-up visit.  She underwent a colonoscopy in April 2025, which revealed a non-precancerous polyp.  Her mammogram in November 2025 was normal. A bone scan in 2023 showed osteopenia. She is currently taking calcium  and vitamin D  supplements.  She started taking Lipitor, one tablet  daily, for cholesterol management with no side effects. She has a history of chronic kidney disease, with a previous abnormal result that has since normalized. She stopped taking ibuprofen  and switched to Tylenol  to avoid affecting her kidneys.  She experiences sleep disturbances, waking up at night to use the bathroom and sometimes having difficulty returning to sleep. She occasionally uses trazodone  for sleep. She is monitoring for any swelling in lymph nodes due to a history of skin issues, including a basal cell carcinoma on her nose.  She is physically active, engaging in weight training three times a week and swimming twice a week. She reports not eating enough fruits and vegetables and not drinking enough water. She volunteers at [facility] and has received her flu shot there. She plans to get a COVID booster from a pharmacy. She is up to date with her vaccinations, including pneumonia, tetanus, shingles, and RSV.  She is separated from her husband, who is still her healthcare power of attorney. She does not have a living will. She takes a powder medication for digestive issues and has switched to almond milk due to concerns about lactose intolerance.  No current issues with mood, feeling down, or worried. No current hearing concerns.     Pregnancy Intention Screening:  The pregnancy intention screening data noted above was reviewed. Potential methods of contraception were discussed. The patient elected to proceed with No data recorded.  Depression Screening;    01/02/2024   11:15 AM 11/27/2023    8:56 AM 06/06/2023    9:03 AM 11/26/2022    3:30 PM 11/26/2022    3:06 PM 11/20/2021    8:53 AM 11/10/2020   10:01 AM  PHQ 2/9 Scores  PHQ - 2 Score 0 0 0 1 1 0 0     Anxiety Screening:    06/21/2020    9:56 AM 05/10/2020    8:34 AM 03/01/2020   10:33 AM 09/14/2019    9:51 AM  GAD 7 : Generalized Anxiety Score  Nervous, Anxious, on Edge 1 2 2  0  Control/stop worrying 1 1 3  0  Worry too  much - different things 1 2 3  0  Trouble relaxing 1 1 1  0  Restless 0 1 0 0  Easily annoyed or irritable 0 1 1 0  Afraid - awful might happen 0 1 2 0  Total GAD 7 Score 4 9 12  0  Anxiety Difficulty Somewhat difficult Not difficult at all Not difficult at all      ROS: Negative unless specifically indicated above in HPI.       Objective:     BP 122/70 (BP Location: Left Arm, Patient Position: Sitting, Cuff Size: Normal)   Pulse (!) 55   Temp 98 F (36.7 C) (Oral)   Ht 5' 8 (1.727 m)   Wt 165 lb (74.8 kg)   SpO2 98%   BMI 25.09 kg/m    Physical Exam GENERAL: Alert, cooperative, well developed, no acute distress. HEAD: Normocephalic atraumatic. EYES: Extraocular movements intact BL, pupils round, equal and reactive to light BL, conjunctivae normal BL. EARS: Tympanic membrane, ear canal and external ear normal BL. NOSE: No congestion or rhinorrhea, mucous membranes are moist. THROAT: No oropharyngeal exudate or posterior oropharyngeal erythema. CARDIOVASCULAR: Normal heart rate and rhythm, S1 and S2 normal without murmurs. CHEST:  Clear to auscultation bilaterally, no wheezes, rhonchi, or crackles. ABDOMEN: Soft, non tender, non distended, without organomegaly, normal bowel sounds. EXTREMITIES: No cyanosis or edema. NEUROLOGICAL: Oriented to person, place and time, no gait abnormalities, moves all extremities without gross motor or sensory deficit. NECK: Thyroid  normal.        Reuben MARLA Burkes, MD  01/02/2024    Contains text generated by Abridge AI Software.

## 2024-01-02 NOTE — Progress Notes (Signed)
 Due for updated Prevnar 20 Sees eye doctor yearly. Sees dentist regularly.

## 2024-01-02 NOTE — Patient Instructions (Signed)
 Thank you for visiting [facility] today! Here's what we talked about: - Get COVID booster from pharmacy - Get a lawyer to draft health power of attorney and once done bring us  a copy for your chart

## 2024-01-27 ENCOUNTER — Encounter

## 2024-02-11 ENCOUNTER — Ambulatory Visit

## 2024-04-09 ENCOUNTER — Ambulatory Visit

## 2024-12-28 ENCOUNTER — Other Ambulatory Visit

## 2025-01-04 ENCOUNTER — Encounter
